# Patient Record
Sex: Female | Born: 1935 | Race: White | Hispanic: No | Marital: Married | Smoking: Former smoker
Health system: Southern US, Community
[De-identification: ages and names within clinical notes are randomized; demographics above are authoritative.]

## PROBLEM LIST (undated history)

## (undated) DIAGNOSIS — H269 Unspecified cataract: Secondary | ICD-10-CM

## (undated) DIAGNOSIS — E079 Disorder of thyroid, unspecified: Secondary | ICD-10-CM

## (undated) DIAGNOSIS — J449 Chronic obstructive pulmonary disease, unspecified: Secondary | ICD-10-CM

## (undated) DIAGNOSIS — K589 Irritable bowel syndrome without diarrhea: Secondary | ICD-10-CM

## (undated) DIAGNOSIS — I509 Heart failure, unspecified: Secondary | ICD-10-CM

## (undated) DIAGNOSIS — I219 Acute myocardial infarction, unspecified: Secondary | ICD-10-CM

## (undated) DIAGNOSIS — I10 Essential (primary) hypertension: Secondary | ICD-10-CM

## (undated) DIAGNOSIS — C50919 Malignant neoplasm of unspecified site of unspecified female breast: Secondary | ICD-10-CM

## (undated) HISTORY — PX: EYE SURGERY: SHX253

## (undated) HISTORY — PX: CHOLECYSTECTOMY: SHX55

## (undated) HISTORY — PX: STENT PLACEMENT VASCULAR (ARMC HX): HXRAD1737

## (undated) HISTORY — PX: CARDIAC SURGERY: SHX584

## (undated) HISTORY — PX: ROTATOR CUFF REPAIR: SHX139

## (undated) HISTORY — PX: ABDOMINAL HYSTERECTOMY: SHX81

## (undated) HISTORY — PX: BREAST SURGERY: SHX581

---

## 2011-12-25 ENCOUNTER — Ambulatory Visit: Payer: Self-pay | Admitting: Internal Medicine

## 2012-02-14 ENCOUNTER — Ambulatory Visit: Payer: Self-pay | Admitting: Internal Medicine

## 2012-03-05 ENCOUNTER — Ambulatory Visit: Payer: Self-pay | Admitting: Orthopedic Surgery

## 2012-03-05 LAB — BASIC METABOLIC PANEL
Anion Gap: 8 (ref 7–16)
Calcium, Total: 9.4 mg/dL (ref 8.5–10.1)
Co2: 24 mmol/L (ref 21–32)
Creatinine: 1.74 mg/dL — ABNORMAL HIGH (ref 0.60–1.30)
EGFR (African American): 33 — ABNORMAL LOW
EGFR (Non-African Amer.): 28 — ABNORMAL LOW
Glucose: 98 mg/dL (ref 65–99)
Potassium: 4.3 mmol/L (ref 3.5–5.1)
Sodium: 140 mmol/L (ref 136–145)

## 2012-03-05 LAB — CBC
HCT: 39.7 % (ref 35.0–47.0)
HGB: 13 g/dL (ref 12.0–16.0)
RDW: 13.8 % (ref 11.5–14.5)
WBC: 10.1 10*3/uL (ref 3.6–11.0)

## 2012-03-05 LAB — PROTIME-INR
INR: 1
Prothrombin Time: 13.7 secs (ref 11.5–14.7)

## 2012-03-05 LAB — APTT: Activated PTT: 26.1 secs (ref 23.6–35.9)

## 2012-03-18 ENCOUNTER — Ambulatory Visit: Payer: Self-pay | Admitting: Orthopedic Surgery

## 2013-04-07 DIAGNOSIS — E7800 Pure hypercholesterolemia, unspecified: Secondary | ICD-10-CM | POA: Diagnosis not present

## 2013-04-07 DIAGNOSIS — I10 Essential (primary) hypertension: Secondary | ICD-10-CM | POA: Diagnosis not present

## 2013-04-07 DIAGNOSIS — I2510 Atherosclerotic heart disease of native coronary artery without angina pectoris: Secondary | ICD-10-CM | POA: Diagnosis not present

## 2013-04-07 DIAGNOSIS — E559 Vitamin D deficiency, unspecified: Secondary | ICD-10-CM | POA: Diagnosis not present

## 2013-04-07 DIAGNOSIS — K219 Gastro-esophageal reflux disease without esophagitis: Secondary | ICD-10-CM | POA: Diagnosis not present

## 2013-04-07 DIAGNOSIS — J9620 Acute and chronic respiratory failure, unspecified whether with hypoxia or hypercapnia: Secondary | ICD-10-CM | POA: Diagnosis not present

## 2013-04-07 DIAGNOSIS — I119 Hypertensive heart disease without heart failure: Secondary | ICD-10-CM | POA: Diagnosis not present

## 2013-05-29 IMAGING — CR DG CHEST 2V
1 series · 2 of 2 positions shown · non-contrast
Comparison: none

REASON FOR EXAM: sob, htn
COMMENTS:

PROCEDURE:     DXR - DXR CHEST PA (OR AP) AND LATERAL  - March 05, 2012 [DATE]
RESULT:     Comparison: None.

[Series 1: pa · 0.17mm/px · 2 of 2 slices shown]
[im 1/2]
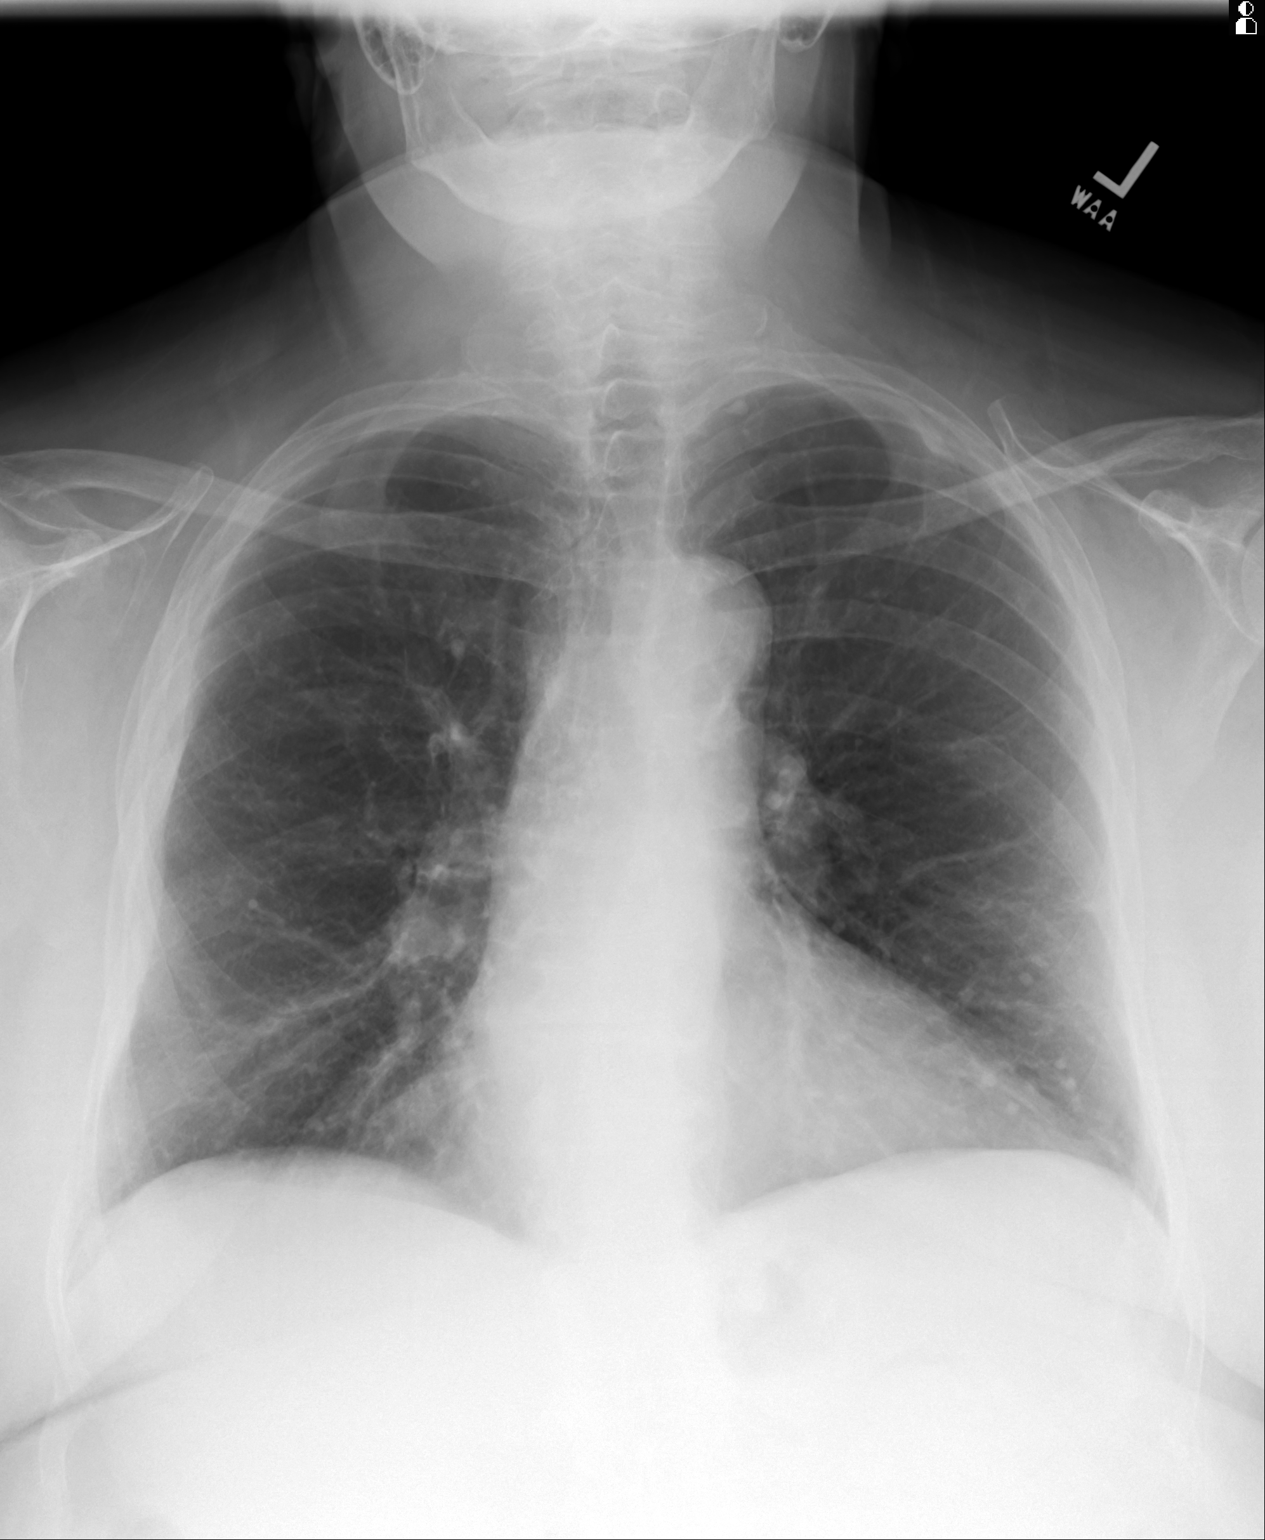
[im 2/2]
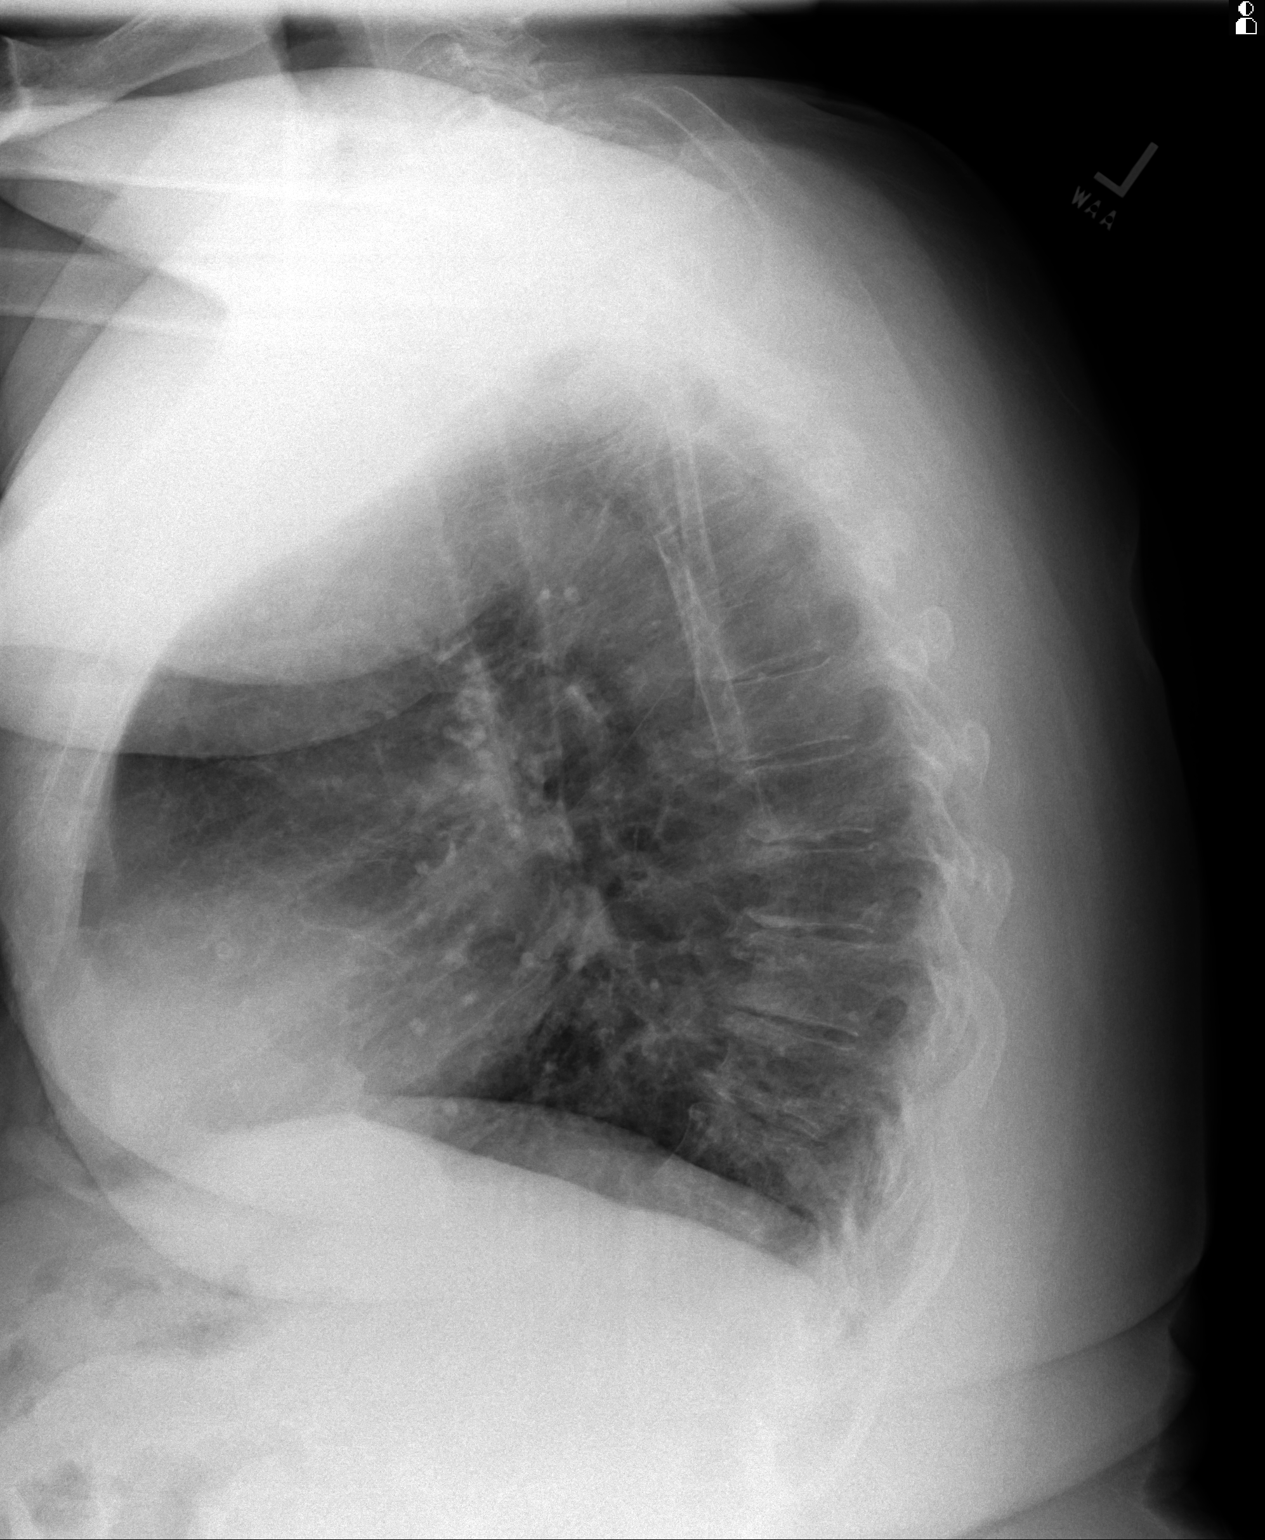

[2 of 2 positions shown; findings below may reference images not displayed]

FINDINGS: The heart is enlarged. Tiny round nodules in the bilateral lungs are likely
calcified given their density to small size. Otherwise, no focal pulmonary
opacities.
IMPRESSION: No acute cardiopulmonary disease.

[REDACTED]

## 2013-09-08 DIAGNOSIS — N183 Chronic kidney disease, stage 3 unspecified: Secondary | ICD-10-CM | POA: Diagnosis not present

## 2013-09-08 DIAGNOSIS — E46 Unspecified protein-calorie malnutrition: Secondary | ICD-10-CM | POA: Diagnosis not present

## 2013-09-08 DIAGNOSIS — I10 Essential (primary) hypertension: Secondary | ICD-10-CM | POA: Diagnosis not present

## 2013-09-08 DIAGNOSIS — I2510 Atherosclerotic heart disease of native coronary artery without angina pectoris: Secondary | ICD-10-CM | POA: Diagnosis not present

## 2013-09-30 DIAGNOSIS — N2581 Secondary hyperparathyroidism of renal origin: Secondary | ICD-10-CM | POA: Diagnosis not present

## 2013-09-30 DIAGNOSIS — N184 Chronic kidney disease, stage 4 (severe): Secondary | ICD-10-CM | POA: Diagnosis not present

## 2013-09-30 DIAGNOSIS — D631 Anemia in chronic kidney disease: Secondary | ICD-10-CM | POA: Diagnosis not present

## 2013-09-30 DIAGNOSIS — I10 Essential (primary) hypertension: Secondary | ICD-10-CM | POA: Diagnosis not present

## 2013-09-30 DIAGNOSIS — N039 Chronic nephritic syndrome with unspecified morphologic changes: Secondary | ICD-10-CM | POA: Diagnosis not present

## 2013-10-08 ENCOUNTER — Ambulatory Visit: Payer: Self-pay | Admitting: Nephrology

## 2013-10-08 DIAGNOSIS — N184 Chronic kidney disease, stage 4 (severe): Secondary | ICD-10-CM | POA: Diagnosis not present

## 2013-10-08 DIAGNOSIS — N269 Renal sclerosis, unspecified: Secondary | ICD-10-CM | POA: Diagnosis not present

## 2013-10-15 DIAGNOSIS — N184 Chronic kidney disease, stage 4 (severe): Secondary | ICD-10-CM | POA: Diagnosis not present

## 2013-10-15 DIAGNOSIS — D631 Anemia in chronic kidney disease: Secondary | ICD-10-CM | POA: Diagnosis not present

## 2013-10-15 DIAGNOSIS — I10 Essential (primary) hypertension: Secondary | ICD-10-CM | POA: Diagnosis not present

## 2013-10-15 DIAGNOSIS — N2581 Secondary hyperparathyroidism of renal origin: Secondary | ICD-10-CM | POA: Diagnosis not present

## 2014-01-17 ENCOUNTER — Emergency Department: Payer: Self-pay | Admitting: Emergency Medicine

## 2014-01-17 DIAGNOSIS — S92401A Displaced unspecified fracture of right great toe, initial encounter for closed fracture: Secondary | ICD-10-CM | POA: Diagnosis not present

## 2014-01-17 DIAGNOSIS — I10 Essential (primary) hypertension: Secondary | ICD-10-CM | POA: Diagnosis not present

## 2014-01-17 DIAGNOSIS — S9031XA Contusion of right foot, initial encounter: Secondary | ICD-10-CM | POA: Diagnosis not present

## 2014-01-17 DIAGNOSIS — S92411A Displaced fracture of proximal phalanx of right great toe, initial encounter for closed fracture: Secondary | ICD-10-CM | POA: Diagnosis not present

## 2014-01-20 DIAGNOSIS — S92411A Displaced fracture of proximal phalanx of right great toe, initial encounter for closed fracture: Secondary | ICD-10-CM | POA: Diagnosis not present

## 2014-02-10 DIAGNOSIS — S92411D Displaced fracture of proximal phalanx of right great toe, subsequent encounter for fracture with routine healing: Secondary | ICD-10-CM | POA: Diagnosis not present

## 2014-02-18 DIAGNOSIS — D631 Anemia in chronic kidney disease: Secondary | ICD-10-CM | POA: Diagnosis not present

## 2014-02-18 DIAGNOSIS — N184 Chronic kidney disease, stage 4 (severe): Secondary | ICD-10-CM | POA: Diagnosis not present

## 2014-02-18 DIAGNOSIS — I10 Essential (primary) hypertension: Secondary | ICD-10-CM | POA: Diagnosis not present

## 2014-02-18 DIAGNOSIS — N2581 Secondary hyperparathyroidism of renal origin: Secondary | ICD-10-CM | POA: Diagnosis not present

## 2014-03-03 DIAGNOSIS — H3530 Unspecified macular degeneration: Secondary | ICD-10-CM | POA: Diagnosis not present

## 2014-05-10 DIAGNOSIS — N184 Chronic kidney disease, stage 4 (severe): Secondary | ICD-10-CM | POA: Diagnosis not present

## 2014-05-10 DIAGNOSIS — E875 Hyperkalemia: Secondary | ICD-10-CM | POA: Diagnosis not present

## 2014-05-10 DIAGNOSIS — N2581 Secondary hyperparathyroidism of renal origin: Secondary | ICD-10-CM | POA: Diagnosis not present

## 2014-05-10 DIAGNOSIS — R809 Proteinuria, unspecified: Secondary | ICD-10-CM | POA: Diagnosis not present

## 2014-05-10 DIAGNOSIS — I129 Hypertensive chronic kidney disease with stage 1 through stage 4 chronic kidney disease, or unspecified chronic kidney disease: Secondary | ICD-10-CM | POA: Diagnosis not present

## 2014-05-14 DIAGNOSIS — Z23 Encounter for immunization: Secondary | ICD-10-CM | POA: Diagnosis not present

## 2014-06-02 DIAGNOSIS — I129 Hypertensive chronic kidney disease with stage 1 through stage 4 chronic kidney disease, or unspecified chronic kidney disease: Secondary | ICD-10-CM | POA: Diagnosis not present

## 2014-06-02 DIAGNOSIS — N184 Chronic kidney disease, stage 4 (severe): Secondary | ICD-10-CM | POA: Diagnosis not present

## 2014-06-13 DIAGNOSIS — I119 Hypertensive heart disease without heart failure: Secondary | ICD-10-CM | POA: Diagnosis not present

## 2014-06-13 DIAGNOSIS — J449 Chronic obstructive pulmonary disease, unspecified: Secondary | ICD-10-CM | POA: Diagnosis not present

## 2014-11-07 DIAGNOSIS — I10 Essential (primary) hypertension: Secondary | ICD-10-CM | POA: Diagnosis not present

## 2014-11-07 DIAGNOSIS — E875 Hyperkalemia: Secondary | ICD-10-CM | POA: Diagnosis not present

## 2014-11-07 DIAGNOSIS — N2581 Secondary hyperparathyroidism of renal origin: Secondary | ICD-10-CM | POA: Diagnosis not present

## 2014-11-07 DIAGNOSIS — N184 Chronic kidney disease, stage 4 (severe): Secondary | ICD-10-CM | POA: Diagnosis not present

## 2014-11-11 DIAGNOSIS — I119 Hypertensive heart disease without heart failure: Secondary | ICD-10-CM | POA: Diagnosis not present

## 2014-11-11 DIAGNOSIS — J449 Chronic obstructive pulmonary disease, unspecified: Secondary | ICD-10-CM | POA: Diagnosis not present

## 2015-02-05 DIAGNOSIS — Z23 Encounter for immunization: Secondary | ICD-10-CM | POA: Diagnosis not present

## 2015-04-28 DIAGNOSIS — R6884 Jaw pain: Secondary | ICD-10-CM | POA: Diagnosis not present

## 2015-04-28 DIAGNOSIS — M542 Cervicalgia: Secondary | ICD-10-CM | POA: Diagnosis not present

## 2015-04-29 ENCOUNTER — Emergency Department
Admission: EM | Admit: 2015-04-29 | Discharge: 2015-04-29 | Disposition: A | Discharge status: Home / Self Care | Payer: Medicare Other | Attending: Emergency Medicine | Admitting: Emergency Medicine

## 2015-04-29 ENCOUNTER — Emergency Department: Payer: Medicare Other

## 2015-04-29 DIAGNOSIS — R6884 Jaw pain: Secondary | ICD-10-CM | POA: Insufficient documentation

## 2015-04-29 DIAGNOSIS — T402X5A Adverse effect of other opioids, initial encounter: Secondary | ICD-10-CM | POA: Insufficient documentation

## 2015-04-29 DIAGNOSIS — R112 Nausea with vomiting, unspecified: Secondary | ICD-10-CM | POA: Diagnosis not present

## 2015-04-29 DIAGNOSIS — G8929 Other chronic pain: Secondary | ICD-10-CM | POA: Diagnosis not present

## 2015-04-29 DIAGNOSIS — Z87891 Personal history of nicotine dependence: Secondary | ICD-10-CM | POA: Diagnosis not present

## 2015-04-29 DIAGNOSIS — I10 Essential (primary) hypertension: Secondary | ICD-10-CM | POA: Insufficient documentation

## 2015-04-29 DIAGNOSIS — Z79899 Other long term (current) drug therapy: Secondary | ICD-10-CM | POA: Diagnosis not present

## 2015-04-29 HISTORY — DX: Acute myocardial infarction, unspecified: I21.9

## 2015-04-29 HISTORY — DX: Disorder of thyroid, unspecified: E07.9

## 2015-04-29 HISTORY — DX: Irritable bowel syndrome without diarrhea: K58.9

## 2015-04-29 HISTORY — DX: Chronic obstructive pulmonary disease, unspecified: J44.9

## 2015-04-29 HISTORY — DX: Essential (primary) hypertension: I10

## 2015-04-29 HISTORY — DX: Unspecified cataract: H26.9

## 2015-04-29 HISTORY — DX: Heart failure, unspecified: I50.9

## 2015-04-29 HISTORY — DX: Malignant neoplasm of unspecified site of unspecified female breast: C50.919

## 2015-04-29 LAB — CBC WITH DIFFERENTIAL/PLATELET
BASOS ABS: 0.1 10*3/uL (ref 0–0.1)
BASOS PCT: 1 %
EOS ABS: 0.2 10*3/uL (ref 0–0.7)
Eosinophils Relative: 2 %
HEMATOCRIT: 39.9 % (ref 35.0–47.0)
HEMOGLOBIN: 12.8 g/dL (ref 12.0–16.0)
Lymphocytes Relative: 11 %
Lymphs Abs: 1.2 10*3/uL (ref 1.0–3.6)
MCH: 28.3 pg (ref 26.0–34.0)
MCHC: 32 g/dL (ref 32.0–36.0)
MCV: 88.6 fL (ref 80.0–100.0)
Monocytes Absolute: 0.7 10*3/uL (ref 0.2–0.9)
Monocytes Relative: 7 %
NEUTROS ABS: 8.5 10*3/uL — AB (ref 1.4–6.5)
NEUTROS PCT: 79 %
Platelets: 297 10*3/uL (ref 150–440)
RBC: 4.51 MIL/uL (ref 3.80–5.20)
RDW: 14 % (ref 11.5–14.5)
WBC: 10.8 10*3/uL (ref 3.6–11.0)

## 2015-04-29 LAB — COMPREHENSIVE METABOLIC PANEL
ALBUMIN: 3.4 g/dL — AB (ref 3.5–5.0)
ALK PHOS: 63 U/L (ref 38–126)
ALT: 13 U/L — ABNORMAL LOW (ref 14–54)
AST: 15 U/L (ref 15–41)
Anion gap: 7 (ref 5–15)
BILIRUBIN TOTAL: 0.3 mg/dL (ref 0.3–1.2)
BUN: 35 mg/dL — AB (ref 6–20)
CO2: 23 mmol/L (ref 22–32)
Calcium: 8.9 mg/dL (ref 8.9–10.3)
Chloride: 104 mmol/L (ref 101–111)
Creatinine, Ser: 1.62 mg/dL — ABNORMAL HIGH (ref 0.44–1.00)
GFR calc Af Amer: 34 mL/min — ABNORMAL LOW (ref >60)
GFR calc non Af Amer: 30 mL/min — ABNORMAL LOW (ref >60)
GLUCOSE: 150 mg/dL — AB (ref 65–99)
POTASSIUM: 4.9 mmol/L (ref 3.5–5.1)
SODIUM: 134 mmol/L — AB (ref 135–145)
TOTAL PROTEIN: 7.6 g/dL (ref 6.5–8.1)

## 2015-04-29 LAB — TROPONIN I

## 2015-04-29 MED ORDER — ONDANSETRON HCL 4 MG/2ML IJ SOLN
4.0000 mg | Freq: Once | INTRAMUSCULAR | Status: AC
  Administered 2015-04-29: 4 mg via INTRAVENOUS
  Filled 2015-04-29: qty 2

## 2015-04-29 MED ORDER — DOCUSATE SODIUM 100 MG PO CAPS: ORAL_CAPSULE | ORAL | Status: DC

## 2015-04-29 MED ORDER — HYDROCODONE-ACETAMINOPHEN 5-325 MG PO TABS: 1.0000 | ORAL_TABLET | ORAL | Status: DC | PRN

## 2015-04-29 MED ORDER — MORPHINE SULFATE (PF) 4 MG/ML IV SOLN
4.0000 mg | Freq: Once | INTRAVENOUS | Status: AC
  Administered 2015-04-29: 4 mg via INTRAVENOUS
  Filled 2015-04-29: qty 1

## 2015-04-29 NOTE — ED Notes (Signed)
Pt 02 dropped to 80%, put pt back on 1L [place], pt 02 up to 94%.

## 2015-04-29 NOTE — ED Notes (Addendum)
Pt presents via [facility] with jaw and neck pain and headache. Pt states jaw pain has been there for "a while" and headache has been present for a few days. Pt gave herself 2 oxycodone (1 at 21:30 and 1 at 2300) left over from a toe surgery last summer, then began dry heaving, per EMS no emesis. Pt states she did not eat anything when she took the Oxy. Pt is now nauseous. Pt has hx of MI, COPD.

## 2015-04-29 NOTE — ED Notes (Addendum)
Pt 02 kept dropping to low and mid 80's, pt was put on [place] 1-2 L to bring 02 up.  Per Dr. Karma Greaser okay to eat and drink. Pt provided crackers and water.

## 2015-04-29 NOTE — ED Provider Notes (Signed)
[facility] Emergency Department Provider Note  ____________________________________________  Time seen: Approximately 2:39 AM  I have reviewed the triage vital signs and the nursing notes.   HISTORY  Chief Complaint Jaw Pain and Headache    HPI Deborah Poole is a 80 y.o. female with an extensive chronic medical history including COPD who does not take use oxygen at baseline who presents with acute worsening of chronic jaw pain.  She states that it starts in the left lower jaw and radiates into her neck as well as around to the right side of her jaw.  It is worse at night and with movement and using the jaw.  It is not originating in the neck or head although she feels that it radiates throughout her head.  It has been present although very mild for months now, but over the last 2 days it has become severe and constant.  She does not remember any traumatic injury.  She tried taking some oxycodone that she had at home but it made her nauseated and she vomited once.  She states that the pain is terrible and she needs help with that although she realizes we may not able to know for sure what is causing it.  She states that when she had a heart attack years ago she had pain radiating to the jaw, but this feels different.She describes it as a severe deep aching pain, not a sharp pain, and based on her description it does not involve the region around her temporomandibular joint.   Past Medical History  Diagnosis Date  . Thyroid disease   . CHF (congestive heart failure) ([place])   . Cataract   . Breast cancer ([place])   . Heart attack ([place])   . COPD (chronic obstructive pulmonary disease) ([place])   . Irritable bowel syndrome (IBS)   . Hypertension     There are no active problems to display for this patient.   Past Surgical History  Procedure Laterality Date  . Cholecystectomy    . Breast surgery    . Cardiac surgery    . Eye surgery    . Stent placement vascular ([facility]  hx)    . Rotator cuff repair    . Abdominal hysterectomy      Current Outpatient Rx  Name  Route  Sig  Dispense  Refill  . ALPRAZolam (XANAX) 0.25 MG tablet   Oral   Take 0.25 mg by mouth at bedtime as needed for sleep.         . carvedilol (COREG) 3.125 MG tablet   Oral   Take 1 tablet by mouth 2 (two) times daily.         . cholecalciferol (VITAMIN D) 1000 units tablet   Oral   Take 1,000 Units by mouth daily.         Marland Kitchen omeprazole (PRILOSEC) 10 MG capsule   Oral   Take 10 mg by mouth daily.         . simvastatin (ZOCOR) 40 MG tablet   Oral   Take 40 mg by mouth every evening.         Marland Kitchen spironolactone (ALDACTONE) 25 MG tablet   Oral   Take 25 mg by mouth daily.           Allergies Review of patient's allergies indicates no known allergies.  History reviewed. No pertinent family history.  Social History Social History  Substance Use Topics  . Smoking status: Former Research scientist (life sciences)  .  Smokeless tobacco: None  . Alcohol Use: Yes     Comment: once a month or so     Review of Systems Constitutional: No fever/chills Eyes: No visual changes. ENT: No sore throat.  Pain in her jaw, chin, and radiating to her neck. Cardiovascular: Denies chest pain. Respiratory: Denies shortness of breath. Gastrointestinal: No abdominal pain.  Episode of nausea and vomiting after taking oxycodone.  No diarrhea.  No constipation. Genitourinary: Negative for dysuria. Musculoskeletal: Negative for back pain. Skin: Negative for rash. Neurological: Negative for headaches, focal weakness or numbness.  10-point ROS otherwise negative.  ____________________________________________   PHYSICAL EXAM:  VITAL SIGNS: ED Triage Vitals  Enc Vitals Group     BP 04/29/15 0119 145/107 mmHg     Pulse Rate 04/29/15 0119 81     Resp 04/29/15 0119 20     Temp 04/29/15 0119 98.7 F (37.1 C)     Temp Source 04/29/15 0119 Oral     SpO2 04/29/15 0119 92 %     Weight 04/29/15 0119 230 lb  (104.327 kg)     Height 04/29/15 0119 5\' 1"  (1.549 m)     Head Cir --      Peak Flow --      Pain Score 04/29/15 0122 6     Pain Loc --      Pain Edu? --      Excl. in [place]? --     Constitutional: Alert and oriented. Well appearing and in no acute distress with appearance of multiple vehicle issues Eyes: Conjunctivae are normal. PERRL. EOMI. Head: Atraumatic. Nose: No congestion/rhinnorhea. Mouth/Throat: Mucous membranes are moist.  Oropharynx non-erythematous.  Edentulous in the bottom jaw.  No evidence of infection or abscess.  No tenderness to palpation from the inside.  I do not appreciate any cervical lymphadenopathy or bony abnormalities of the jaw upon palpation of the outside of her mandible although she reports it is tender "deep inside".  There is no misalignment.  Even though she is edentulous she is able to bite down on a tongue depressor and does not experience any pain or instability when I forcibly move the tongue depressor against her bite. Neck: No stridor.   Cardiovascular: Normal rate, regular rhythm. Grossly normal heart sounds.  Good peripheral circulation. Respiratory: Normal respiratory effort.  No retractions. Lungs CTAB. Gastrointestinal: Soft and nontender. No distention. No abdominal bruits. No CVA tenderness. Musculoskeletal: No lower extremity tenderness nor edema.  No joint effusions. Neurologic:  Normal speech and language. No gross focal neurologic deficits are appreciated.  Skin:  Skin is warm, dry and intact. No rash noted. Psychiatric: Mood and affect are normal. Speech and behavior are normal.  ____________________________________________   LABS (all labs ordered are listed, but only abnormal results are displayed)  Labs Reviewed - No data to display ____________________________________________  EKG  ED ECG REPORT I, Anab Vivar, the attending physician, personally viewed and interpreted this ECG.   Date: 04/29/2015  EKG Time: 1:17  Rate:  84  Rhythm: normal sinus rhythm  Axis: Left axis deviation  Intervals:Occasional PVC, left anterior fascicular block  ST&T Change: Non-specific ST segment / T-wave changes, but no evidence of acute ischemia.   ____________________________________________  RADIOLOGY   No results found.  ____________________________________________   PROCEDURES  Procedure(s) performed: None  Critical Care performed: No ____________________________________________   INITIAL IMPRESSION / ASSESSMENT AND PLAN / ED COURSE  Pertinent labs & imaging results that were available during my care of the patient were reviewed  by me and considered in my medical decision making (see chart for details).  This would be a highly unusual atypical presentation of ACS.  It is most likely a musculoskeletal disturbance and her face and jaw, although it is not consistent with TMJ.  I will evaluate with a maxillofacial CT to make sure she does not have any destructive bony lesions which is causing the worsening of her chronic pain, but I anticipate it will likely be unremarkable.  We will also check basic lab work to make sure that this is not an unusual anginal equivalent.  I will attempt to control her pain but stressed with her the need to follow up as an outpatient  ----------------------------------------- 4:41 AM on 04/29/2015 -----------------------------------------  The workup has been unremarkable.  The patient states that she has chronic COPD and that her oxygen level frequently drops down and is typically at the low 90s at baseline which it is been here in the emergency department.  It drops down low or when she is sleeping but I have observed that when does drop below she is not always getting a good waveform.  She has no acute respiratory distress, reports no difficulty breathing, and has clear lung sounds.  I do not believe she needs further intervention for the intermittent hypoxemia in the setting of  chronic COPD and no symptoms.  Her CT scan of her face is essentially normal and although she does have opacification of the right maxillary sinus she has no signs or symptoms of sinusitis.  I will treat her with pain medicine for her acute on chronic jaw pain and advised close outpatient follow-up.  She understands and agrees with the plan.  ____________________________________________  FINAL CLINICAL IMPRESSION(S) / ED DIAGNOSES  Final diagnoses:  Jaw pain, non-TMJ      NEW MEDICATIONS STARTED DURING THIS VISIT:  New Prescriptions   DOCUSATE SODIUM (COLACE) 100 MG CAPSULE    Take 1 tablet once or twice daily as needed for constipation while taking narcotic pain medicine   HYDROCODONE-ACETAMINOPHEN (NORCO/VICODIN) 5-325 MG TABLET    Take 1-2 tablets by mouth every 4 (four) hours as needed for moderate pain.     Hinda Kehr, MD 04/29/15 [phone]

## 2015-04-29 NOTE — ED Notes (Signed)
Took pt off 02. 02 holding at 91-92%.

## 2015-04-29 NOTE — ED Notes (Signed)
Pt transported to CT via stretcher.  

## 2015-04-29 NOTE — ED Notes (Signed)
Dr. Forbach at bedside.  

## 2015-04-29 NOTE — Discharge Instructions (Signed)
As we discussed, your workup today was reassuring.  Though we do not know exactly what is causing your symptoms, it appears that you have no emergent medical condition at this time are safe to go home and follow up as recommended in this paperwork.  Please return immediately to the Emergency Department if you develop any new or worsening symptoms that concern you.   Pain Without a Known Cause WHAT IS PAIN WITHOUT A KNOWN CAUSE? Pain can occur in any part of the body and can range from mild to severe. Sometimes no cause can be found for why you are having pain. Some types of pain that can occur without a known cause include:   Headache.  Back pain.  Abdominal pain.  Neck pain. HOW IS PAIN WITHOUT A KNOWN CAUSE DIAGNOSED?  Your health care provider will try to find the cause of your pain. This may include:  Physical exam.  Medical history.  Blood tests.  Urine tests.  X-rays. If no cause is found, your health care provider may diagnose you with pain without a known cause.  IS THERE TREATMENT FOR PAIN WITHOUT A CAUSE?  Treatment depends on the kind of pain you have. Your health care provider may prescribe medicines to help relieve your pain.  WHAT CAN I DO AT HOME FOR MY PAIN?   Take medicines only as directed by your health care provider.  Stop any activities that cause pain. During periods of severe pain, bed rest may help.  Try to reduce your stress with activities such as yoga or meditation. Talk to your health care provider for other stress-reducing activity recommendations.  Exercise regularly, if approved by your health care provider.  Eat a healthy diet that includes fruits and vegetables. This may improve pain. Talk to your health care provider if you have any questions about your diet. WHAT IF MY PAIN DOES NOT GET BETTER?  If you have a painful condition and no reason can be found for the pain or the pain gets worse, it is important to follow up with your health care  provider. It may be necessary to repeat tests and look further for a possible cause.    This information is not intended to replace advice given to you by your health care provider. Make sure you discuss any questions you have with your health care provider.   Document Released: 12/11/2000 Document Revised: 04/08/2014 Document Reviewed: 08/03/2013 Elsevier Interactive Patient Education [employer].

## 2015-04-29 NOTE — ED Notes (Signed)
Took pt off oxygen, will monitor saturation.

## 2015-06-01 ENCOUNTER — Emergency Department: Payer: Medicare Other

## 2015-06-01 ENCOUNTER — Inpatient Hospital Stay
Admission: EM | Admit: 2015-06-01 | Discharge: 2015-06-02 | DRG: 683 | Disposition: A | Discharge status: Home Health | Payer: Medicare Other | Attending: Internal Medicine | Admitting: Internal Medicine

## 2015-06-01 ENCOUNTER — Encounter: Payer: Self-pay | Admitting: Emergency Medicine

## 2015-06-01 DIAGNOSIS — Z87891 Personal history of nicotine dependence: Secondary | ICD-10-CM

## 2015-06-01 DIAGNOSIS — I129 Hypertensive chronic kidney disease with stage 1 through stage 4 chronic kidney disease, or unspecified chronic kidney disease: Secondary | ICD-10-CM | POA: Diagnosis not present

## 2015-06-01 DIAGNOSIS — M5137 Other intervertebral disc degeneration, lumbosacral region: Secondary | ICD-10-CM | POA: Diagnosis present

## 2015-06-01 DIAGNOSIS — J441 Chronic obstructive pulmonary disease with (acute) exacerbation: Secondary | ICD-10-CM

## 2015-06-01 DIAGNOSIS — D3501 Benign neoplasm of right adrenal gland: Secondary | ICD-10-CM | POA: Diagnosis present

## 2015-06-01 DIAGNOSIS — K589 Irritable bowel syndrome without diarrhea: Secondary | ICD-10-CM | POA: Diagnosis present

## 2015-06-01 DIAGNOSIS — M1611 Unilateral primary osteoarthritis, right hip: Secondary | ICD-10-CM | POA: Diagnosis present

## 2015-06-01 DIAGNOSIS — I509 Heart failure, unspecified: Secondary | ICD-10-CM | POA: Diagnosis present

## 2015-06-01 DIAGNOSIS — J449 Chronic obstructive pulmonary disease, unspecified: Secondary | ICD-10-CM | POA: Diagnosis present

## 2015-06-01 DIAGNOSIS — E079 Disorder of thyroid, unspecified: Secondary | ICD-10-CM | POA: Diagnosis present

## 2015-06-01 DIAGNOSIS — Z6841 Body Mass Index (BMI) 40.0 and over, adult: Secondary | ICD-10-CM

## 2015-06-01 DIAGNOSIS — Z853 Personal history of malignant neoplasm of breast: Secondary | ICD-10-CM | POA: Diagnosis not present

## 2015-06-01 DIAGNOSIS — I130 Hypertensive heart and chronic kidney disease with heart failure and stage 1 through stage 4 chronic kidney disease, or unspecified chronic kidney disease: Secondary | ICD-10-CM | POA: Diagnosis present

## 2015-06-01 DIAGNOSIS — K5732 Diverticulitis of large intestine without perforation or abscess without bleeding: Secondary | ICD-10-CM | POA: Diagnosis present

## 2015-06-01 DIAGNOSIS — N183 Chronic kidney disease, stage 3 (moderate): Secondary | ICD-10-CM | POA: Diagnosis present

## 2015-06-01 DIAGNOSIS — M25551 Pain in right hip: Secondary | ICD-10-CM | POA: Diagnosis not present

## 2015-06-01 DIAGNOSIS — M47816 Spondylosis without myelopathy or radiculopathy, lumbar region: Secondary | ICD-10-CM | POA: Diagnosis present

## 2015-06-01 DIAGNOSIS — N179 Acute kidney failure, unspecified: Secondary | ICD-10-CM | POA: Diagnosis present

## 2015-06-01 DIAGNOSIS — K449 Diaphragmatic hernia without obstruction or gangrene: Secondary | ICD-10-CM | POA: Diagnosis present

## 2015-06-01 DIAGNOSIS — J9611 Chronic respiratory failure with hypoxia: Secondary | ICD-10-CM | POA: Diagnosis present

## 2015-06-01 DIAGNOSIS — Z471 Aftercare following joint replacement surgery: Secondary | ICD-10-CM | POA: Diagnosis not present

## 2015-06-01 DIAGNOSIS — I252 Old myocardial infarction: Secondary | ICD-10-CM

## 2015-06-01 DIAGNOSIS — H269 Unspecified cataract: Secondary | ICD-10-CM | POA: Diagnosis present

## 2015-06-01 DIAGNOSIS — N170 Acute kidney failure with tubular necrosis: Secondary | ICD-10-CM | POA: Diagnosis not present

## 2015-06-01 DIAGNOSIS — Z79899 Other long term (current) drug therapy: Secondary | ICD-10-CM

## 2015-06-01 DIAGNOSIS — Z96641 Presence of right artificial hip joint: Secondary | ICD-10-CM | POA: Diagnosis present

## 2015-06-01 DIAGNOSIS — M4316 Spondylolisthesis, lumbar region: Secondary | ICD-10-CM | POA: Diagnosis present

## 2015-06-01 DIAGNOSIS — Z8249 Family history of ischemic heart disease and other diseases of the circulatory system: Secondary | ICD-10-CM | POA: Diagnosis not present

## 2015-06-01 DIAGNOSIS — R52 Pain, unspecified: Secondary | ICD-10-CM

## 2015-06-01 DIAGNOSIS — E669 Obesity, unspecified: Secondary | ICD-10-CM | POA: Diagnosis present

## 2015-06-01 DIAGNOSIS — I10 Essential (primary) hypertension: Secondary | ICD-10-CM | POA: Diagnosis not present

## 2015-06-01 DIAGNOSIS — N172 Acute kidney failure with medullary necrosis: Secondary | ICD-10-CM | POA: Diagnosis not present

## 2015-06-01 LAB — CBC WITH DIFFERENTIAL/PLATELET
Basophils Absolute: 0.1 10*3/uL (ref 0–0.1)
Basophils Relative: 1 %
EOS ABS: 0.2 10*3/uL (ref 0–0.7)
EOS PCT: 2 %
HCT: 42.3 % (ref 35.0–47.0)
Hemoglobin: 13.8 g/dL (ref 12.0–16.0)
LYMPHS ABS: 1.4 10*3/uL (ref 1.0–3.6)
Lymphocytes Relative: 15 %
MCH: 28.7 pg (ref 26.0–34.0)
MCHC: 32.6 g/dL (ref 32.0–36.0)
MCV: 87.9 fL (ref 80.0–100.0)
MONOS PCT: 7 %
Monocytes Absolute: 0.7 10*3/uL (ref 0.2–0.9)
Neutro Abs: 7.1 10*3/uL — ABNORMAL HIGH (ref 1.4–6.5)
Neutrophils Relative %: 75 %
PLATELETS: 270 10*3/uL (ref 150–440)
RBC: 4.81 MIL/uL (ref 3.80–5.20)
RDW: 13.9 % (ref 11.5–14.5)
WBC: 9.5 10*3/uL (ref 3.6–11.0)

## 2015-06-01 LAB — BASIC METABOLIC PANEL
ANION GAP: 12 (ref 5–15)
BUN: 66 mg/dL — AB (ref 6–20)
CHLORIDE: 103 mmol/L (ref 101–111)
CO2: 21 mmol/L — AB (ref 22–32)
CREATININE: 2.84 mg/dL — AB (ref 0.44–1.00)
Calcium: 9.7 mg/dL (ref 8.9–10.3)
GFR calc non Af Amer: 15 mL/min — ABNORMAL LOW (ref >60)
GFR, EST AFRICAN AMERICAN: 17 mL/min — AB (ref >60)
Glucose, Bld: 91 mg/dL (ref 65–99)
POTASSIUM: 4.8 mmol/L (ref 3.5–5.1)
Sodium: 136 mmol/L (ref 135–145)

## 2015-06-01 MED ORDER — CARVEDILOL 3.125 MG PO TABS
3.1250 mg | ORAL_TABLET | Freq: Two times a day (BID) | ORAL | Status: DC
  Administered 2015-06-02: 3.125 mg via ORAL
  Filled 2015-06-01: qty 1

## 2015-06-01 MED ORDER — HEPARIN SODIUM (PORCINE) 5000 UNIT/ML IJ SOLN
5000.0000 [IU] | Freq: Three times a day (TID) | INTRAMUSCULAR | Status: DC
  Administered 2015-06-01 – 2015-06-02 (×2): 5000 [IU] via SUBCUTANEOUS
  Filled 2015-06-01 (×2): qty 1

## 2015-06-01 MED ORDER — PANTOPRAZOLE SODIUM 40 MG PO TBEC
40.0000 mg | DELAYED_RELEASE_TABLET | Freq: Every day | ORAL | Status: DC
  Administered 2015-06-02: 40 mg via ORAL
  Filled 2015-06-01: qty 1

## 2015-06-01 MED ORDER — ACETAMINOPHEN 650 MG RE SUPP: 650.0000 mg | Freq: Four times a day (QID) | RECTAL | Status: DC | PRN

## 2015-06-01 MED ORDER — SODIUM CHLORIDE 0.9 % IV SOLN: 250.0000 mL | INTRAVENOUS | Status: DC | PRN

## 2015-06-01 MED ORDER — ONDANSETRON HCL 4 MG PO TABS: 4.0000 mg | ORAL_TABLET | Freq: Four times a day (QID) | ORAL | Status: DC | PRN

## 2015-06-01 MED ORDER — HYDROCODONE-ACETAMINOPHEN 5-325 MG PO TABS
1.0000 | ORAL_TABLET | ORAL | Status: DC | PRN
  Administered 2015-06-01: 2 via ORAL
  Filled 2015-06-01: qty 2

## 2015-06-01 MED ORDER — SODIUM CHLORIDE 0.9% FLUSH
3.0000 mL | INTRAVENOUS | Status: DC | PRN
  Administered 2015-06-01: 3 mL via INTRAVENOUS
  Filled 2015-06-01: qty 3

## 2015-06-01 MED ORDER — HYDROMORPHONE HCL 1 MG/ML IJ SOLN
INTRAMUSCULAR | Status: AC
  Administered 2015-06-01: 0.5 mg via INTRAVENOUS
  Filled 2015-06-01: qty 1

## 2015-06-01 MED ORDER — SIMVASTATIN 40 MG PO TABS
40.0000 mg | ORAL_TABLET | Freq: Every evening | ORAL | Status: DC
  Administered 2015-06-01: 40 mg via ORAL
  Filled 2015-06-01: qty 1

## 2015-06-01 MED ORDER — ACETAMINOPHEN 325 MG PO TABS
650.0000 mg | ORAL_TABLET | Freq: Four times a day (QID) | ORAL | Status: DC | PRN
  Administered 2015-06-02: 650 mg via ORAL
  Filled 2015-06-01: qty 2

## 2015-06-01 MED ORDER — MAGNESIUM OXIDE 400 (241.3 MG) MG PO TABS
400.0000 mg | ORAL_TABLET | ORAL | Status: DC
  Administered 2015-06-02: 400 mg via ORAL
  Filled 2015-06-01: qty 1

## 2015-06-01 MED ORDER — BISACODYL 10 MG RE SUPP
10.0000 mg | Freq: Every day | RECTAL | Status: DC | PRN
  Filled 2015-06-01: qty 1

## 2015-06-01 MED ORDER — ONDANSETRON HCL 4 MG/2ML IJ SOLN
4.0000 mg | Freq: Once | INTRAMUSCULAR | Status: AC
  Administered 2015-06-01: 4 mg via INTRAVENOUS

## 2015-06-01 MED ORDER — ALPRAZOLAM 0.25 MG PO TABS
0.2500 mg | ORAL_TABLET | Freq: Every evening | ORAL | Status: DC | PRN
  Administered 2015-06-01: 0.25 mg via ORAL
  Filled 2015-06-01: qty 1

## 2015-06-01 MED ORDER — DOCUSATE SODIUM 100 MG PO CAPS
100.0000 mg | ORAL_CAPSULE | Freq: Two times a day (BID) | ORAL | Status: DC
Start: 2015-06-01 — End: 2015-06-02
  Administered 2015-06-01: 100 mg via ORAL
  Filled 2015-06-01 (×2): qty 1

## 2015-06-01 MED ORDER — HYDRALAZINE HCL 20 MG/ML IJ SOLN: 10.0000 mg | Freq: Four times a day (QID) | INTRAMUSCULAR | Status: DC | PRN

## 2015-06-01 MED ORDER — SODIUM CHLORIDE 0.9% FLUSH: 3.0000 mL | Freq: Two times a day (BID) | INTRAVENOUS | Status: DC

## 2015-06-01 MED ORDER — IPRATROPIUM-ALBUTEROL 0.5-2.5 (3) MG/3ML IN SOLN: 3.0000 mL | RESPIRATORY_TRACT | Status: DC | PRN

## 2015-06-01 MED ORDER — POLYETHYLENE GLYCOL 3350 17 G PO PACK: 17.0000 g | PACK | Freq: Every day | ORAL | Status: DC | PRN

## 2015-06-01 MED ORDER — ONDANSETRON HCL 4 MG/2ML IJ SOLN
INTRAMUSCULAR | Status: AC
  Administered 2015-06-01: 4 mg via INTRAVENOUS
  Filled 2015-06-01: qty 2

## 2015-06-01 MED ORDER — ONDANSETRON HCL 4 MG/2ML IJ SOLN: 4.0000 mg | Freq: Four times a day (QID) | INTRAMUSCULAR | Status: DC | PRN

## 2015-06-01 MED ORDER — HYDROMORPHONE HCL 1 MG/ML IJ SOLN
0.5000 mg | Freq: Once | INTRAMUSCULAR | Status: AC
  Administered 2015-06-01: 0.5 mg via INTRAVENOUS

## 2015-06-01 MED ORDER — SODIUM CHLORIDE 0.9 % IV SOLN
INTRAVENOUS | Status: DC
  Administered 2015-06-01: 22:00:00 via INTRAVENOUS

## 2015-06-01 MED ORDER — NIFEDIPINE ER OSMOTIC RELEASE 30 MG PO TB24
30.0000 mg | ORAL_TABLET | ORAL | Status: DC
  Administered 2015-06-02: 30 mg via ORAL
  Filled 2015-06-01: qty 1

## 2015-06-01 NOTE — H&P (Signed)
Deborah Poole at [place] NAME: Deborah Poole    MR#:  [id number]  DATE OF BIRTH:  05-Jun-1935  DATE OF ADMISSION:  06/01/2015  PRIMARY CARE PHYSICIAN: Marden Noble, MD   REQUESTING/REFERRING PHYSICIAN: Dr. Cinda Quest  CHIEF COMPLAINT:   Chief Complaint  Patient presents with  . Hip Pain    HISTORY OF PRESENT ILLNESS:  Deborah Poole  is a 80 y.o. female with a known history of COPD, CHF, hypertension, CKD stage III presents to the emergency room complaining of right hip pain of 3 days. This has caused significant problem with walking where patient is unable to bear weight. She had a right hip replacement 10 years back. She does have mild on and off pain but this is much worse. X-ray showed no fractures. A CT scan of the abdomen was checked and this showed degenerative changes and some simple cysts. Possible mild acute sigmoid diverticulitis. Patient has no diarrhea, abdominal pain, nausea or vomiting. No recent falls. Blood work showed acute renal failure and patient is being admitted. Patient was recently in the emergency room 5 days back for some right jaw and neck pain and was discharged home on pain medications. She mentions that she took excess pills and was doped up because of the pain medications and could have had decreased oral intake.  Follows as outpatient with Dr. Juleen China.  PAST MEDICAL HISTORY:   Past Medical History  Diagnosis Date  . Thyroid disease   . CHF (congestive heart failure) ([place])   . Cataract   . Breast cancer ([place])   . Heart attack ([place])   . COPD (chronic obstructive pulmonary disease) ([place])   . Irritable bowel syndrome (IBS)   . Hypertension     PAST SURGICAL HISTORY:   Past Surgical History  Procedure Laterality Date  . Cholecystectomy    . Breast surgery    . Cardiac surgery    . Eye surgery    . Stent placement vascular ([facility] hx)    . Rotator cuff repair    . Abdominal hysterectomy      SOCIAL  HISTORY:   Social History  Substance Use Topics  . Smoking status: Former Research scientist (life sciences)  . Smokeless tobacco: Not on file  . Alcohol Use: 0.0 oz/week    0 Standard drinks or equivalent per week     Comment: once a month or so     FAMILY HISTORY:   Family History  Problem Relation Age of Onset  . Hypertension Other     DRUG ALLERGIES:  No Known Allergies  REVIEW OF SYSTEMS:   Review of Systems  Constitutional: Positive for malaise/fatigue. Negative for fever, chills and weight loss.  HENT: Negative for hearing loss and nosebleeds.   Eyes: Negative for blurred vision, double vision and pain.  Respiratory: Negative for cough, hemoptysis, sputum production, shortness of breath and wheezing.   Cardiovascular: Negative for chest pain, palpitations, orthopnea and leg swelling.  Gastrointestinal: Negative for nausea, vomiting, abdominal pain, diarrhea and constipation.  Genitourinary: Negative for dysuria and hematuria.  Musculoskeletal: Positive for back pain, joint pain and falls. Negative for myalgias.  Skin: Negative for rash.  Neurological: Positive for weakness. Negative for dizziness, tremors, sensory change, speech change, focal weakness, seizures and headaches.  Endo/Heme/Allergies: Does not bruise/bleed easily.  Psychiatric/Behavioral: Negative for depression and memory loss. The patient is not nervous/anxious.     MEDICATIONS AT HOME:   Prior to Admission medications   Medication  Sig Start Date End Date Taking? Authorizing Provider  ALPRAZolam Duanne Moron) 0.25 MG tablet Take 0.25 mg by mouth at bedtime as needed for sleep.    Historical Provider, MD  carvedilol (COREG) 3.125 MG tablet Take 1 tablet by mouth 2 (two) times daily.    Historical Provider, MD  cholecalciferol (VITAMIN D) 1000 units tablet Take 1,000 Units by mouth daily.    Historical Provider, MD  docusate sodium (COLACE) 100 MG capsule Take 1 tablet once or twice daily as needed for constipation while taking  narcotic pain medicine 04/29/15   Hinda Kehr, MD  HYDROcodone-acetaminophen (NORCO/VICODIN) 5-325 MG tablet Take 1-2 tablets by mouth every 4 (four) hours as needed for moderate pain. 04/29/15   Hinda Kehr, MD  omeprazole (PRILOSEC) 10 MG capsule Take 10 mg by mouth daily.    Historical Provider, MD  simvastatin (ZOCOR) 40 MG tablet Take 40 mg by mouth every evening.    Historical Provider, MD  spironolactone (ALDACTONE) 25 MG tablet Take 25 mg by mouth daily.    Historical Provider, MD     VITAL SIGNS:  Blood pressure 159/97, pulse 81, temperature 97.9 F (36.6 C), temperature source Oral, resp. rate 18, height 5\' 1"  (1.549 m), weight 99.791 kg (220 lb), SpO2 92 %.  PHYSICAL EXAMINATION:  Physical Exam  GENERAL:  80 y.o.-year-old patient lying in the bed with no acute distress. Obese EYES: Pupils equal, round, reactive to light and accommodation. No scleral icterus. Extraocular muscles intact.  HEENT: Head atraumatic, normocephalic. Oropharynx and nasopharynx clear. No oropharyngeal erythema, moist oral mucosa  NECK:  Supple, no jugular venous distention. No thyroid enlargement, no tenderness.  LUNGS: Normal breath sounds bilaterally, no wheezing, rales, rhonchi. No use of accessory muscles of respiration.  CARDIOVASCULAR: S1, S2 normal. No murmurs, rubs, or gallops.  ABDOMEN: Soft, nontender, nondistended. Bowel sounds present. No organomegaly or mass.  EXTREMITIES: No pedal edema, cyanosis, or clubbing. + 2 pedal & radial pulses b/l.  Pain in right hip with passive range of motion. No point tenderness found. NEUROLOGIC: Cranial nerves II through XII are intact. No focal Motor or sensory deficits appreciated b/l PSYCHIATRIC: The patient is alert and oriented x 3. Good affect.  SKIN: No obvious rash, lesion, or ulcer.   LABORATORY PANEL:   CBC  Recent Labs Lab 06/01/15 1555  WBC 9.5  HGB 13.8  HCT 42.3  PLT 270    ------------------------------------------------------------------------------------------------------------------  Chemistries   Recent Labs Lab 06/01/15 1555  NA 136  K 4.8  CL 103  CO2 21*  GLUCOSE 91  BUN 66*  CREATININE 2.84*  CALCIUM 9.7   ------------------------------------------------------------------------------------------------------------------  Cardiac Enzymes No results for input(s): TROPONINI in the last 168 hours. ------------------------------------------------------------------------------------------------------------------  RADIOLOGY:  Ct Renal Stone Study  06/01/2015  CLINICAL DATA:  Hip pain.  Hip prosthesis.  Popping sensation. EXAM: CT ABDOMEN AND PELVIS WITHOUT CONTRAST TECHNIQUE: Multidetector CT imaging of the abdomen and pelvis was performed following the standard protocol without IV contrast. COMPARISON:  06/01/2015 FINDINGS: Lower chest: Subsegmental atelectasis or scarring in the posterior basal segments of both lower lobes and anteriorly in the left lower lobe. There is scattered calcified granulomas in both lower lobes. Mild cardiomegaly is present with coronary artery atherosclerotic calcification. Small type 1 hiatal hernia. Hepatobiliary: Punctate calcifications in the liver compatible with remote granulomatous process. Cholecystectomy. Pancreas: Unremarkable Spleen: Punctate calcifications in the spleen compatible with remote granulomatous process. Adrenals/Urinary Tract: 1.3 by 1.9 cm right adrenal mass, internal density 9 Hounsfield units, compatible with  adenoma. Bilateral exophytic renal lesions. In the left mid kidney a lateral lesion measuring 1.3 cm in diameter has internal density of 29 Hounsfield units (complex). In the right kidney lower pole there are several fluid density lesions in addition to a 1.4 cm lesion with internal density 13 Hounsfield units. Another right kidney lower pole lesion posterolaterally measures 1.3 cm and has an  internal density of 10 Hounsfield units. A simple cystic lesion of the right kidney lower pole measures 5 Hounsfield units in density. There several stones in the vicinity of the right distal ureter which are not thought to be within the ureter itself. Admittedly parts of the ureter are obscured by streak artifact from the adjacent right hip hip implant. The bladder is empty. There vascular calcifications along both renal hila. No definite stones. Stomach/Bowel: Appendix normal. Sigmoid diverticulosis with faint stranding along the sigmoid mesentery suspicious for low-grade acute diverticulitis. This has shown on the coronal images such as images 97 through 81 of series 5. There is also diverticulosis of the descending colon. Vascular/Lymphatic: Aortoiliac atherosclerotic vascular disease. Reproductive: Left ovarian remnant 2.0 by 2.1 cm. Uterus absent. Right ovary not seen. Other: No supplemental non-categorized findings. Musculoskeletal: No specific complicating feature having to do with visualized portion of the right hip prosthesis. Lumbar spondylosis and degenerative disc disease with grade 1 anterolisthesis at L4-5. Disc uncovering, disc bulge, and facet arthropathy cause mild bilateral foraminal stenosis and at least mild central narrowing of the thecal sac at this level. There is also more striking foraminal impingement bilaterally at L5-S1 primarily due to the intervertebral spurring. IMPRESSION: 1. Acute mild sigmoid colon diverticulitis. 2. I do not observe an acute abnormality specifically having to do with the right hip prosthesis. The distal margin of the stem was not included on today's exam, but appeared normal on earlier conventional radiography. 3. Lumbar spondylosis and degenerative disc disease with prominent bilateral foraminal impingement at L5-S1 and mild impingement at L4-5. 4. Bilateral renal exophytic lesions, several of which appear to be complex. Differential diagnostic considerations  include complex renal cysts or small renal cell carcinoma. Given the lack of prior renal protocol MRI with and without contrast for further workup in the non emergent setting. Cross-sectional imaging, consider cardiomegaly with coronary atherosclerosis. 5. Old granulomatous disease. 6. Small right adrenal adenoma. 7. Small type 1 hiatal hernia. Electronically Signed   By: Van Clines M.D.   On: 06/01/2015 17:57   Dg Hip Unilat  With Pelvis 2-3 Views Right  06/01/2015  CLINICAL DATA:  Difficulty ambulating with right hip pain, initial encounter EXAM: DG HIP (WITH OR WITHOUT PELVIS) 2-3V RIGHT COMPARISON:  None. FINDINGS: Right hip prosthesis is noted. No dislocation is identified. No apparent loosening or other bony abnormality is seen. No soft tissue changes are noted. IMPRESSION: Status post right hip replacement without acute abnormality. Electronically Signed   By: Inez Catalina M.D.   On: 06/01/2015 15:18     IMPRESSION AND PLAN:   * ARF over Chronic kidney disease stage III Start IVF Likely due to decreased intake Repeat labs in AM NO obstruction on CT abd  * Right hip pain Likely from arthritis. No fracture or dislocation on x-ray Pain medications added. Consult physical therapy.  * Hypertension Continue Coreg. Hold Aldactone.  *  COPD   patient has descended into the 38s in the emergency room. She had similar issue with temporary desaturations into the 80s during her recent trip to the emergency room. She likely will need home  oxygen. Recently will keep her on 2 L oxygen. Likely has chronic respiratory failure.  * DVT prophylaxis with heparin  All the records are reviewed and case discussed with ED provider. Management plans discussed with the patient, family and they are in agreement.  CODE STATUS: FULL  TOTAL TIME TAKING CARE OF THIS PATIENT: 40 minutes.   Hillary Bow R M.D on 06/01/2015 at 6:52 PM  Between 7am to 6pm - Pager - [phone]  After 6pm go to  [email or web address] - password [email or web address] [facility]  Office  [phone]  CC: Primary care physician; Marden Noble, MD  Note: This dictation was prepared with Dragon dictation along with smaller phrase technology. Any transcriptional errors that result from this process are unintentional.

## 2015-06-01 NOTE — ED Provider Notes (Addendum)
[facility] Emergency Department Provider Note  ____________________________________________  Time seen: Approximately 4:13 PM  I have reviewed the triage vital signs and the nursing notes.   HISTORY  Chief Complaint Hip Pain    HPI Deborah Poole is a 80 y.o. female patient reports she's been having a little bit of low back pain which radiates from the middle of her back to each buttock but no further. That's really not been bothering her much but then 2 days ago she was getting out of bed in the morning she began having a very bad pain in the right hip. She is taking some oxycodone 2.5 for that but really isn't helping in it hurts a lot to put weight on it. Patient denies any fever chills nausea vomiting patient does have some pain in the quadriceps on both sides patient is able to move but putting weight on the hip or sitting up hurts a lot. There is no swelling in the area and she does not remember hurting it although his popped occasionally. The hips been in place for about 10 years was placed in [place].   Past Medical History  Diagnosis Date  . Thyroid disease   . CHF (congestive heart failure) ([place])   . Cataract   . Breast cancer ([place])   . Heart attack ([place])   . COPD (chronic obstructive pulmonary disease) ([place])   . Irritable bowel syndrome (IBS)   . Hypertension     There are no active problems to display for this patient.   Past Surgical History  Procedure Laterality Date  . Cholecystectomy    . Breast surgery    . Cardiac surgery    . Eye surgery    . Stent placement vascular ([facility] hx)    . Rotator cuff repair    . Abdominal hysterectomy      Current Outpatient Rx  Name  Route  Sig  Dispense  Refill  . ALPRAZolam (XANAX) 0.25 MG tablet   Oral   Take 0.25 mg by mouth at bedtime as needed for sleep.         . carvedilol (COREG) 3.125 MG tablet   Oral   Take 1 tablet by mouth 2 (two) times daily.         . cholecalciferol (VITAMIN D)  1000 units tablet   Oral   Take 1,000 Units by mouth daily.         Marland Kitchen docusate sodium (COLACE) 100 MG capsule      Take 1 tablet once or twice daily as needed for constipation while taking narcotic pain medicine   30 capsule   0   . HYDROcodone-acetaminophen (NORCO/VICODIN) 5-325 MG tablet   Oral   Take 1-2 tablets by mouth every 4 (four) hours as needed for moderate pain.   20 tablet   0   . omeprazole (PRILOSEC) 10 MG capsule   Oral   Take 10 mg by mouth daily.         . simvastatin (ZOCOR) 40 MG tablet   Oral   Take 40 mg by mouth every evening.         Marland Kitchen spironolactone (ALDACTONE) 25 MG tablet   Oral   Take 25 mg by mouth daily.           Allergies Review of patient's allergies indicates no known allergies.  No family history on file.  Social History Social History  Substance Use Topics  . Smoking status: Former Research scientist (life sciences)  .  Smokeless tobacco: None  . Alcohol Use: Yes     Comment: once a month or so     Review of Systems Constitutional: No fever/chills Eyes: No visual changes. ENT: No sore throat. Cardiovascular: Denies chest pain. Respiratory: Denies shortness of breath. Gastrointestinal: No abdominal pain.  No nausea, no vomiting.  No diarrhea.  No constipation. Genitourinary: Negative for dysuria. Musculoskeletal: Negative for back pain. Skin: Negative for rash. Neurological: Negative for headaches, focal weakness or numbness.  10-point ROS otherwise negative.  ____________________________________________   PHYSICAL EXAM:  VITAL SIGNS: ED Triage Vitals  Enc Vitals Group     BP 06/01/15 1249 159/97 mmHg     Pulse Rate 06/01/15 1249 90     Resp 06/01/15 1249 18     Temp 06/01/15 1249 97.9 F (36.6 C)     Temp Source 06/01/15 1249 Oral     SpO2 06/01/15 1249 88 %     Weight 06/01/15 1244 220 lb (99.791 kg)     Height 06/01/15 1244 5\' 1"  (1.549 m)     Head Cir --      Peak Flow --      Pain Score --      Pain Loc --      Pain Edu?  --      Excl. in [place]? --     Constitutional: Alert and oriented. Well appearing and in no acute distress. Eyes: Conjunctivae are normal. PERRL. EOMI. Head: Atraumatic. Nose: No congestion/rhinnorhea. Mouth/Throat: Mucous membranes are moist.  Oropharynx non-erythematous. Neck: No stridor.   Cardiovascular: Normal rate, regular rhythm. Grossly normal heart sounds.  Good peripheral circulation. Respiratory: Normal respiratory effort.  No retractions. Lungs CTAB. Gastrointestinal: Soft and nontender. No distention. No abdominal bruits. No CVA tenderness. Musculoskeletal: No lower extremity tenderness nor edema.  No joint effusions. Neurologic:  Normal speech and language. No gross focal neurologic deficits are appreciated. No gait instability. Skin:  Skin is warm, dry and intact. No rash noted. Psychiatric: Mood and affect are normal. Speech and behavior are normal.  ____________________________________________   LABS (all labs ordered are listed, but only abnormal results are displayed)  Labs Reviewed  BASIC METABOLIC PANEL - Abnormal; Notable for the following:    CO2 21 (*)    BUN 66 (*)    Creatinine, Ser 2.84 (*)    GFR calc non Af Amer 15 (*)    GFR calc Af Amer 17 (*)    All other components within normal limits  CBC WITH DIFFERENTIAL/PLATELET - Abnormal; Notable for the following:    Neutro Abs 7.1 (*)    All other components within normal limits   ____________________________________________  EKG   ____________________________________________  RADIOLOGY hip and pelvis read by radiology as no acute disease with ____________________________________________   PROCEDURES    ____________________________________________   INITIAL IMPRESSION / ASSESSMENT AND PLAN / ED COURSE  Pertinent labs & imaging results that were available during my care of the patient were reviewed by me and considered in my medical decision making (see chart for  details).   ____________________________________________   FINAL CLINICAL IMPRESSION(S) / ED DIAGNOSES  Final diagnoses:  Pain  Acute renal failure, unspecified acute renal failure type [facility])      Nena Polio, MD 06/01/15 1830  CT read and interpreted by radiology showed no apparent etiology for the renal failure there is some  Nena Polio, MD 06/01/15 [phone]

## 2015-06-01 NOTE — ED Notes (Signed)
Pt states she had a right hip replacement years ago, states it has been "popping" out of place occasionally from what she can tell, this am, unable to walk, in "excruciating" pain. Took her oxycodone at home with very little relief.

## 2015-06-02 LAB — BASIC METABOLIC PANEL
ANION GAP: 8 (ref 5–15)
BUN: 67 mg/dL — AB (ref 6–20)
CALCIUM: 9.1 mg/dL (ref 8.9–10.3)
CO2: 22 mmol/L (ref 22–32)
Chloride: 106 mmol/L (ref 101–111)
Creatinine, Ser: 2.48 mg/dL — ABNORMAL HIGH (ref 0.44–1.00)
GFR calc Af Amer: 20 mL/min — ABNORMAL LOW (ref >60)
GFR, EST NON AFRICAN AMERICAN: 18 mL/min — AB (ref >60)
GLUCOSE: 88 mg/dL (ref 65–99)
Potassium: 4.8 mmol/L (ref 3.5–5.1)
Sodium: 136 mmol/L (ref 135–145)

## 2015-06-02 MED ORDER — MORPHINE SULFATE (PF) 2 MG/ML IV SOLN
2.0000 mg | Freq: Four times a day (QID) | INTRAVENOUS | Status: DC | PRN
  Administered 2015-06-02 (×2): 2 mg via INTRAVENOUS
  Filled 2015-06-02 (×2): qty 1

## 2015-06-02 MED ORDER — OXYCODONE-ACETAMINOPHEN 2.5-325 MG PO TABS: 1.0000 | ORAL_TABLET | ORAL | Status: DC | PRN

## 2015-06-02 NOTE — Progress Notes (Deleted)
[id number]: 82% saturation on room air 0930: 92% on [facility]

## 2015-06-02 NOTE — Progress Notes (Signed)
0929: 82% saturation on room air 0930: 92% on 2LNC at rest 0932: 87% on 2L with exertion 0934: 94% on 3L at rest 0936: 94% on 3L with exertion  Patient walked around nursing station with walker.

## 2015-06-02 NOTE — Discharge Summary (Addendum)
Deborah Poole, is a 80 y.o. female  DOB 10-Dec-1935  MRN [id number].  Admission date:  06/01/2015  Admitting Physician  Hillary Bow, MD  Discharge Date:  06/02/2015   Primary MD  Marden Noble, MD  Recommendations for primary care physician for things to follow:    follow Up with primary doctor in 1 week   Admission Diagnosis  Pain [R52] Acute renal failure, unspecified acute renal failure type [facility]) [N17.9]   Discharge Diagnosis  Pain [R52] Acute renal failure, unspecified acute renal failure type ([place]) [N17.9]    Active Problems:   ARF (acute renal failure) ([place])      Past Medical History  Diagnosis Date  . Thyroid disease   . CHF (congestive heart failure) ([place])   . Cataract   . Breast cancer ([place])   . Heart attack ([place])   . COPD (chronic obstructive pulmonary disease) ([place])   . Irritable bowel syndrome (IBS)   . Hypertension     Past Surgical History  Procedure Laterality Date  . Cholecystectomy    . Breast surgery    . Cardiac surgery    . Eye surgery    . Stent placement vascular ([facility])    . Rotator cuff repair    . Abdominal hysterectomy         History of present illness and  Hospital Course:     Kindly see H&P for history of present illness and admission details, please review complete Labs, Consult reports and Test reports for all details in brief  HPI  from the history and physical done on the day of admission 80 year old female patient with history of COPD, congestive heart failure, hypertension, chronic kidney disease stage III complains of right hip pain and admitted for the same. Patient had right hip replacement 10 years back. No history of fall. Patient also found to have acute on chronic renal failure. So she is admitted for acute on chronic renal failure, right hip  pain.   Hospital Course  #1 acute renal failure due to ATN: Due to poor by mouth intake. Patient renal function improved and back to baseline with IV hydration. Patient can follow up with [facility] from nephrology as an outpatient. #2 right hip pain due to arthritis: X-ray of the right hip did not show any fracture. Patient can see [facility] ortho as an outpatient for right hip pain. Continue pain meds. #3 mild acute sigmoid diverticulitis: Patient has no abdominal pain or diarrhea and nausea vomiting: Monitor clinically at this time , no further treatment needed at this time. 4. COPD with hypoxia patient saturations are around 88%: O2 saturation dropped to 82%on room air while ambulating and also at rest. She needed 3 L of oxygen. So we will discharge her on now 3 L of oxygen. 5 back pain secondary to degenerative joint disease. Continue pain medication.     Discharge Condition: Stable.   Follow UP   follow up with primary doctor in 1 week   Discharge Instructions  and  Discharge Medications        Medication List    STOP taking these medications        ibuprofen 200 MG tablet  Commonly known as:  ADVIL,MOTRIN      TAKE these medications        albuterol 108 (90 Base) MCG/ACT inhaler  Commonly known as:  PROVENTIL HFA;VENTOLIN HFA  Inhale 2 puffs into the lungs every 6 (six) hours as needed for wheezing  or shortness of breath.     ALPRAZolam 0.25 MG tablet  Commonly known as:  XANAX  Take 0.25-0.5 mg by mouth at bedtime as needed for anxiety or sleep.     carvedilol 3.125 MG tablet  Commonly known as:  COREG  Take 1 tablet by mouth 2 (two) times daily.     cholecalciferol 1000 units tablet  Commonly known as:  VITAMIN D  Take 1,000 Units by mouth daily.     Magnesium 250 MG Tabs  Take 250 mg by mouth every morning.     NIFEdipine 30 MG 24 hr tablet  Commonly known as:  PROCARDIA-XL/ADALAT CC  Take 30 mg by mouth every morning.     omeprazole 10 MG capsule   Commonly known as:  PRILOSEC  Take 10 mg by mouth every morning.     oxycodone-acetaminophen 2.5-325 MG tablet  Commonly known as:  PERCOCET  Take 1 tablet by mouth every 4 (four) hours as needed for pain.     oyster calcium 500 MG Tabs tablet  Take 500 mg of elemental calcium by mouth every morning.     simvastatin 40 MG tablet  Commonly known as:  ZOCOR  Take 40 mg by mouth every morning.     spironolactone 25 MG tablet  Commonly known as:  ALDACTONE  Take 25 mg by mouth every morning.          Diet and Activity recommendation: See Discharge Instructions above   Consults obtained - none   Major procedures and Radiology Reports - PLEASE review detailed and final reports for all details, in brief -      Ct Renal Stone Study  06/01/2015  CLINICAL DATA:  Hip pain.  Hip prosthesis.  Popping sensation. EXAM: CT ABDOMEN AND PELVIS WITHOUT CONTRAST TECHNIQUE: Multidetector CT imaging of the abdomen and pelvis was performed following the standard protocol without IV contrast. COMPARISON:  06/01/2015 FINDINGS: Lower chest: Subsegmental atelectasis or scarring in the posterior basal segments of both lower lobes and anteriorly in the left lower lobe. There is scattered calcified granulomas in both lower lobes. Mild cardiomegaly is present with coronary artery atherosclerotic calcification. Small type 1 hiatal hernia. Hepatobiliary: Punctate calcifications in the liver compatible with remote granulomatous process. Cholecystectomy. Pancreas: Unremarkable Spleen: Punctate calcifications in the spleen compatible with remote granulomatous process. Adrenals/Urinary Tract: 1.3 by 1.9 cm right adrenal mass, internal density 9 Hounsfield units, compatible with adenoma. Bilateral exophytic renal lesions. In the left mid kidney a lateral lesion measuring 1.3 cm in diameter has internal density of 29 Hounsfield units (complex). In the right kidney lower pole there are several fluid density lesions in  addition to a 1.4 cm lesion with internal density 13 Hounsfield units. Another right kidney lower pole lesion posterolaterally measures 1.3 cm and has an internal density of 10 Hounsfield units. A simple cystic lesion of the right kidney lower pole measures 5 Hounsfield units in density. There several stones in the vicinity of the right distal ureter which are not thought to be within the ureter itself. Admittedly parts of the ureter are obscured by streak artifact from the adjacent right hip hip implant. The bladder is empty. There vascular calcifications along both renal hila. No definite stones. Stomach/Bowel: Appendix normal. Sigmoid diverticulosis with faint stranding along the sigmoid mesentery suspicious for low-grade acute diverticulitis. This has shown on the coronal images such as images 97 through 81 of series 5. There is also diverticulosis of the descending colon. Vascular/Lymphatic: Aortoiliac atherosclerotic vascular  disease. Reproductive: Left ovarian remnant 2.0 by 2.1 cm. Uterus absent. Right ovary not seen. Other: No supplemental non-categorized findings. Musculoskeletal: No specific complicating feature having to do with visualized portion of the right hip prosthesis. Lumbar spondylosis and degenerative disc disease with grade 1 anterolisthesis at L4-5. Disc uncovering, disc bulge, and facet arthropathy cause mild bilateral foraminal stenosis and at least mild central narrowing of the thecal sac at this level. There is also more striking foraminal impingement bilaterally at L5-S1 primarily due to the intervertebral spurring. IMPRESSION: 1. Acute mild sigmoid colon diverticulitis. 2. I do not observe an acute abnormality specifically having to do with the right hip prosthesis. The distal margin of the stem was not included on today's exam, but appeared normal on earlier conventional radiography. 3. Lumbar spondylosis and degenerative disc disease with prominent bilateral foraminal impingement at  L5-S1 and mild impingement at L4-5. 4. Bilateral renal exophytic lesions, several of which appear to be complex. Differential diagnostic considerations include complex renal cysts or small renal cell carcinoma. Given the lack of prior renal protocol MRI with and without contrast for further workup in the non emergent setting. Cross-sectional imaging, consider cardiomegaly with coronary atherosclerosis. 5. Old granulomatous disease. 6. Small right adrenal adenoma. 7. Small type 1 hiatal hernia. Electronically Signed   By: Van Clines M.D.   On: 06/01/2015 17:57   Dg Hip Unilat  With Pelvis 2-3 Views Right  06/01/2015  CLINICAL DATA:  Difficulty ambulating with right hip pain, initial encounter EXAM: DG HIP (WITH OR WITHOUT PELVIS) 2-3V RIGHT COMPARISON:  None. FINDINGS: Right hip prosthesis is noted. No dislocation is identified. No apparent loosening or other bony abnormality is seen. No soft tissue changes are noted. IMPRESSION: Status post right hip replacement without acute abnormality. Electronically Signed   By: Inez Catalina M.D.   On: 06/01/2015 15:18    Micro Results    No results found for this or any previous visit (from the past 240 hour(s)).     Today   Subjective:   Deborah Poole today has no headache,no chest abdominal pain,no new weakness tingling or numbness, feels much better wants to go home today.   Objective:   Blood pressure 138/66, pulse 78, temperature 97.7 F (36.5 C), temperature source Axillary, resp. rate 20, height 5\' 1"  (1.549 m), weight 99.909 kg (220 lb 4.2 oz), SpO2 82 %.   Intake/Output Summary (Last 24 hours) at 06/02/15 0936 Last data filed at 06/01/15 2217  Gross per 24 hour  Intake      3 ml  Output      0 ml  Net      3 ml    Exam Awake Alert, Oriented x 3, No new F.N deficits, Normal affect [place].AT,PERRAL Supple Neck,No JVD, No cervical lymphadenopathy appriciated.  Symmetrical Chest wall movement, Good air movement bilaterally, CTAB RRR,No  Gallops,Rubs or new Murmurs, No Parasternal Heave +ve B.Sounds, Abd Soft, Non tender, No organomegaly appriciated, No rebound -guarding or rigidity. No Cyanosis, Clubbing or edema, No new Rash or bruise  Data Review   CBC w Diff: Lab Results  Component Value Date   WBC 9.5 06/01/2015   WBC 10.1 03/05/2012   HGB 13.8 06/01/2015   HGB 13.0 03/05/2012   HCT 42.3 06/01/2015   HCT 39.7 03/05/2012   PLT 270 06/01/2015   PLT 251 03/05/2012   LYMPHOPCT 15 06/01/2015   MONOPCT 7 06/01/2015   EOSPCT 2 06/01/2015   BASOPCT 1 06/01/2015    CMP: Lab  Results  Component Value Date   NA 136 06/02/2015   NA 140 03/05/2012   K 4.8 06/02/2015   K 4.3 03/05/2012   CL 106 06/02/2015   CL 108* 03/05/2012   CO2 22 06/02/2015   CO2 24 03/05/2012   BUN 67* 06/02/2015   BUN 44* 03/05/2012   CREATININE 2.48* 06/02/2015   CREATININE 1.74* 03/05/2012   PROT 7.6 04/29/2015   ALBUMIN 3.4* 04/29/2015   BILITOT 0.3 04/29/2015   ALKPHOS 63 04/29/2015   AST 15 04/29/2015   ALT 13* 04/29/2015  .   Total Time in preparing paper work, data evaluation and todays exam - 66 minutes  Deborah Poole M.D on 06/02/2015 at 9:36 AM    Note: This dictation was prepared with Dragon dictation along with smaller phrase technology. Any transcriptional errors that result from this process are unintentional.

## 2015-06-02 NOTE — Progress Notes (Signed)
Pt requesting pain medication.  Norco is ordered but it causes her to itch. New order received for Morphine 2 mg IV every 6 hours

## 2015-06-02 NOTE — Progress Notes (Signed)
   06/02/15 1115  Clinical Encounter Type  Visited With Patient  Visit Type Initial  Referral From Nurse  Consult/Referral To Chaplain  Spiritual Encounters  Spiritual Needs Literature  Stress Factors  Patient Stress Factors Health changes  Advance Directives (For Healthcare)  Does patient have an advance directive? No  Would patient like information on creating an advanced directive? Yes - Scientist, clinical (histocompatibility and immunogenetics) given  Provided AD education & materials at patient's request. Patient intends to take documents home and complete there.  Chap. Ysidra Sopher G. [place]

## 2015-06-02 NOTE — Progress Notes (Signed)
SATURATION QUALIFICATIONS: (This note is used to comply with regulatory documentation for home oxygen)  Patient Saturations on Room Air at Rest = 82%  Patient Saturations on Room Air while Ambulating = 82%   Patient Saturations on 3 Liters of oxygen while Ambulating = 94%  Please briefly explain why patient needs home oxygen: COPD

## 2015-06-02 NOTE — Care Management Note (Signed)
Case Management Note  Patient Details  Name: Deborah Poole MRN: [id number] Date of Birth: April 10, 1935  Subjective/Objective:                  Met with patient to discuss discharge planning. Her PCP is Dr. Brynda Greathouse. She is new to O2. She lives with her husband that has mild dementia. She denies being homebound. Her husband has used [facility] however patient denies need for home health. She ambulates with a walker/rollator.  Action/Plan:  Patient qualifies for home O2. Referral made to [place] care. No further RNCM needs. Case closed.   Expected Discharge Date:                  Expected Discharge Plan:     In-House Referral:     Discharge planning Services  CM Consult  Post Acute Care Choice:  Home Health, Durable Medical Equipment Choice offered to:  Patient  DME Arranged:  Oxygen DME Agency:  [place]:    [place] Agency:     Status of Service:  In process, will continue to follow  Medicare Important Message Given:    Date Medicare IM Given:    Medicare IM give by:    Date Additional Medicare IM Given:    Additional Medicare Important Message give by:     If discussed at [place] of Stay Meetings, dates discussed:    Additional Comments:  Marshell Garfinkel, RN 06/02/2015, 11:01 AM

## 2015-06-02 NOTE — Progress Notes (Signed)
Patient alert and oriented, VSS, some chronic pain in hip and back.   Follow up in 1 week with PCP.  Removed PIV.  Rx for percocet.    Planned to be escorted out of hospital via wheelchair by nursing staff with oxygen.  3L

## 2015-06-06 DIAGNOSIS — I259 Chronic ischemic heart disease, unspecified: Secondary | ICD-10-CM | POA: Diagnosis not present

## 2015-06-06 DIAGNOSIS — I129 Hypertensive chronic kidney disease with stage 1 through stage 4 chronic kidney disease, or unspecified chronic kidney disease: Secondary | ICD-10-CM | POA: Diagnosis not present

## 2015-06-26 DIAGNOSIS — K5900 Constipation, unspecified: Secondary | ICD-10-CM | POA: Diagnosis not present

## 2015-07-05 DIAGNOSIS — M5136 Other intervertebral disc degeneration, lumbar region: Secondary | ICD-10-CM | POA: Diagnosis not present

## 2015-07-05 DIAGNOSIS — M545 Low back pain: Secondary | ICD-10-CM | POA: Diagnosis not present

## 2015-07-05 DIAGNOSIS — M5416 Radiculopathy, lumbar region: Secondary | ICD-10-CM | POA: Diagnosis not present

## 2015-07-11 DIAGNOSIS — M4607 Spinal enthesopathy, lumbosacral region: Secondary | ICD-10-CM | POA: Diagnosis not present

## 2015-07-11 DIAGNOSIS — M9903 Segmental and somatic dysfunction of lumbar region: Secondary | ICD-10-CM | POA: Diagnosis not present

## 2015-07-11 DIAGNOSIS — M5137 Other intervertebral disc degeneration, lumbosacral region: Secondary | ICD-10-CM | POA: Diagnosis not present

## 2015-07-12 DIAGNOSIS — M5137 Other intervertebral disc degeneration, lumbosacral region: Secondary | ICD-10-CM | POA: Diagnosis not present

## 2015-07-12 DIAGNOSIS — M4607 Spinal enthesopathy, lumbosacral region: Secondary | ICD-10-CM | POA: Diagnosis not present

## 2015-07-12 DIAGNOSIS — M9903 Segmental and somatic dysfunction of lumbar region: Secondary | ICD-10-CM | POA: Diagnosis not present

## 2015-07-14 DIAGNOSIS — M9903 Segmental and somatic dysfunction of lumbar region: Secondary | ICD-10-CM | POA: Diagnosis not present

## 2015-07-14 DIAGNOSIS — M4607 Spinal enthesopathy, lumbosacral region: Secondary | ICD-10-CM | POA: Diagnosis not present

## 2015-07-14 DIAGNOSIS — M5137 Other intervertebral disc degeneration, lumbosacral region: Secondary | ICD-10-CM | POA: Diagnosis not present

## 2015-07-21 ENCOUNTER — Telehealth: Payer: Self-pay | Admitting: *Deleted

## 2015-07-21 NOTE — Telephone Encounter (Signed)
Given pre procedure instructions for the [facility] appointment.

## 2015-07-21 NOTE — Telephone Encounter (Signed)
per Dr. Quita Skye by telephone to add this pt to his schedule for 08/01/15@ 2pm for procedure. i sw pt made her aware that her appt for 08/21/15 has change to 08/01/15. Pt is fine with this appt change. However i do not have override access will give to [place]..TD

## 2015-07-24 DIAGNOSIS — M4607 Spinal enthesopathy, lumbosacral region: Secondary | ICD-10-CM | POA: Diagnosis not present

## 2015-07-24 DIAGNOSIS — M9903 Segmental and somatic dysfunction of lumbar region: Secondary | ICD-10-CM | POA: Diagnosis not present

## 2015-07-24 DIAGNOSIS — M5137 Other intervertebral disc degeneration, lumbosacral region: Secondary | ICD-10-CM | POA: Diagnosis not present

## 2015-07-26 DIAGNOSIS — M4607 Spinal enthesopathy, lumbosacral region: Secondary | ICD-10-CM | POA: Diagnosis not present

## 2015-07-26 DIAGNOSIS — M5137 Other intervertebral disc degeneration, lumbosacral region: Secondary | ICD-10-CM | POA: Diagnosis not present

## 2015-07-26 DIAGNOSIS — M9903 Segmental and somatic dysfunction of lumbar region: Secondary | ICD-10-CM | POA: Diagnosis not present

## 2015-08-01 ENCOUNTER — Encounter: Payer: Self-pay | Admitting: Anesthesiology

## 2015-08-01 ENCOUNTER — Ambulatory Visit: Payer: Medicare Other | Attending: Anesthesiology | Admitting: Anesthesiology

## 2015-08-01 VITALS — BP 144/71 | HR 83 | Temp 98.8°F | Resp 20 | Ht 60.0 in | Wt 204.0 lb

## 2015-08-01 DIAGNOSIS — Z9049 Acquired absence of other specified parts of digestive tract: Secondary | ICD-10-CM | POA: Diagnosis not present

## 2015-08-01 DIAGNOSIS — K219 Gastro-esophageal reflux disease without esophagitis: Secondary | ICD-10-CM | POA: Insufficient documentation

## 2015-08-01 DIAGNOSIS — M5136 Other intervertebral disc degeneration, lumbar region: Secondary | ICD-10-CM | POA: Diagnosis not present

## 2015-08-01 DIAGNOSIS — F419 Anxiety disorder, unspecified: Secondary | ICD-10-CM | POA: Diagnosis not present

## 2015-08-01 DIAGNOSIS — Z87891 Personal history of nicotine dependence: Secondary | ICD-10-CM | POA: Insufficient documentation

## 2015-08-01 DIAGNOSIS — M5432 Sciatica, left side: Secondary | ICD-10-CM | POA: Diagnosis not present

## 2015-08-01 DIAGNOSIS — K589 Irritable bowel syndrome without diarrhea: Secondary | ICD-10-CM | POA: Insufficient documentation

## 2015-08-01 DIAGNOSIS — M549 Dorsalgia, unspecified: Secondary | ICD-10-CM | POA: Diagnosis present

## 2015-08-01 DIAGNOSIS — M542 Cervicalgia: Secondary | ICD-10-CM | POA: Diagnosis not present

## 2015-08-01 DIAGNOSIS — M5442 Lumbago with sciatica, left side: Secondary | ICD-10-CM | POA: Insufficient documentation

## 2015-08-01 DIAGNOSIS — I252 Old myocardial infarction: Secondary | ICD-10-CM | POA: Insufficient documentation

## 2015-08-01 DIAGNOSIS — J449 Chronic obstructive pulmonary disease, unspecified: Secondary | ICD-10-CM | POA: Insufficient documentation

## 2015-08-01 DIAGNOSIS — I10 Essential (primary) hypertension: Secondary | ICD-10-CM | POA: Insufficient documentation

## 2015-08-01 MED ORDER — TRIAMCINOLONE ACETONIDE 40 MG/ML IJ SUSP
40.0000 mg | Freq: Once | INTRAMUSCULAR | Status: AC
Start: 2015-08-01 — End: ?

## 2015-08-01 MED ORDER — ROPIVACAINE HCL 2 MG/ML IJ SOLN
INTRAMUSCULAR | Status: AC
Start: 2015-08-01 — End: 2015-08-01
  Administered 2015-08-01: 15:00:00
  Filled 2015-08-01: qty 10

## 2015-08-01 MED ORDER — SODIUM CHLORIDE 0.9% FLUSH: 10.0000 mL | Freq: Once | INTRAVENOUS | Status: AC

## 2015-08-01 MED ORDER — SODIUM CHLORIDE 0.9 % IJ SOLN
INTRAMUSCULAR | Status: AC
  Administered 2015-08-01: 15:00:00
  Filled 2015-08-01: qty 10

## 2015-08-01 MED ORDER — MIDAZOLAM HCL 5 MG/5ML IJ SOLN
INTRAMUSCULAR | Status: AC
  Filled 2015-08-01: qty 5

## 2015-08-01 MED ORDER — FENTANYL CITRATE (PF) 100 MCG/2ML IJ SOLN
INTRAMUSCULAR | Status: AC
  Filled 2015-08-01: qty 2

## 2015-08-01 MED ORDER — LIDOCAINE HCL (PF) 1 % IJ SOLN: 5.0000 mL | Freq: Once | INTRAMUSCULAR | Status: AC

## 2015-08-01 MED ORDER — TRIAMCINOLONE ACETONIDE 40 MG/ML IJ SUSP
INTRAMUSCULAR | Status: AC
  Administered 2015-08-01: 15:00:00
  Filled 2015-08-01: qty 1

## 2015-08-01 MED ORDER — ROPIVACAINE HCL 2 MG/ML IJ SOLN: 10.0000 mL | Freq: Once | INTRAMUSCULAR | Status: AC

## 2015-08-01 MED ORDER — IOPAMIDOL (ISOVUE-M 200) INJECTION 41%
INTRAMUSCULAR | Status: AC
  Administered 2015-08-01: 15:00:00
  Filled 2015-08-01: qty 10

## 2015-08-01 MED ORDER — LIDOCAINE HCL (PF) 1 % IJ SOLN
INTRAMUSCULAR | Status: AC
  Administered 2015-08-01: 15:00:00
  Filled 2015-08-01: qty 5

## 2015-08-01 NOTE — Patient Instructions (Addendum)
Pain Management Discharge Instructions  General Discharge Instructions :  If you need to reach your doctor call: Monday-Friday 8:00 am - 4:00 pm at [phone] or toll free [phone].  After clinic hours [phone] to have operator reach doctor.  Bring all of your medication bottles to all your appointments in the pain clinic.  To cancel or reschedule your appointment with Pain Management please remember to call 24 hours in advance to avoid a fee.  Refer to the educational materials which you have been given on: General Risks, I had my Procedure. Discharge Instructions, Post Sedation.  Post Procedure Instructions:  The drugs you were given will stay in your system until tomorrow, so for the next 24 hours you should not drive, make any legal decisions or drink any alcoholic beverages.  You may eat anything you prefer, but it is better to start with liquids then soups and crackers, and gradually work up to solid foods.  Please notify your doctor immediately if you have any unusual bleeding, trouble breathing or pain that is not related to your normal pain.  Depending on the type of procedure that was done, some parts of your body may feel week and/or numb.  This usually clears up by tonight or the next day.  Walk with the use of an assistive device or accompanied by an adult for the 24 hours.  You may use ice on the affected area for the first 24 hours.  Put ice in a Ziploc bag and cover with a towel and place against area 15 minutes on 15 minutes off.  You may switch to heat after 24 hours.Epidural Steroid Injection An epidural steroid injection is given to relieve pain in your neck, back, or legs that is caused by the irritation or swelling of a nerve root. This procedure involves injecting a steroid and numbing medicine (anesthetic) into the epidural space. The epidural space is the space between the outer covering of your spinal cord and the bones that form your backbone  (vertebra).  LET [facility] CARE PROVIDER KNOW ABOUT:  1. Any allergies you have. 2. All medicines you are taking, including vitamins, herbs, eye drops, creams, and over-the-counter medicines such as aspirin. 3. Previous problems you or members of your family have had with the use of anesthetics. 4. Any blood disorders or blood clotting disorders you have. 5. Previous surgeries you have had. 6. Medical conditions you have. RISKS AND COMPLICATIONS Generally, this is a safe procedure. However, as with any procedure, complications can occur. Possible complications of epidural steroid injection include: 1. Headache. 2. Bleeding. 3. Infection. 4. Allergic reaction to the medicines. 5. Damage to your nerves. The response to this procedure depends on the underlying cause of the pain and its duration. People who have long-term (chronic) pain are less likely to benefit from epidural steroids than are those people whose pain comes on strong and suddenly. BEFORE THE PROCEDURE  1. Ask your health care provider about changing or stopping your regular medicines. You may be advised to stop taking blood-thinning medicines a few days before the procedure. 2. You may be given medicines to reduce anxiety. 3. Arrange for someone to take you home after the procedure. PROCEDURE  1. You will remain awake during the procedure. You may receive medicine to make you relaxed. 2. You will be asked to lie on your stomach. 3. The injection site will be cleaned. 4. The injection site will be numbed with a medicine (local anesthetic). 5. A needle will be  injected through your skin into the epidural space. 6. Your health care provider will use an X-ray machine to ensure that the steroid is delivered closest to the affected nerve. You may have minimal discomfort at this time. 7. Once the needle is in the right position, the local anesthetic and the steroid will be injected into the epidural space. 8. The needle will then  be removed and a bandage will be applied to the injection site. AFTER THE PROCEDURE  1. You may be monitored for a short time before you go home. 2. You may feel weakness or numbness in your arm or leg, which disappears within hours. 3. You may be allowed to eat, drink, and take your regular medicine. 4. You may have soreness at the site of the injection.   This information is not intended to replace advice given to you by your health care provider. Make sure you discuss any questions you have with your health care provider.   Document Released: 06/25/2007 Document Revised: 11/18/2012 Document Reviewed: 09/04/2012 Elsevier Interactive Patient Education 2016 [employer]  What are the risk, side effects and possible complications? Generally speaking, most procedures are safe.  However, with any procedure there are risks, side effects, and the possibility of complications.  The risks and complications are dependent upon the sites that are lesioned, or the type of nerve block to be performed.  The closer the procedure is to the spine, the more serious the risks are.  Great care is taken when placing the radio frequency needles, block needles or lesioning probes, but sometimes complications can occur. 1. Infection: Any time there is an injection through the skin, there is a risk of infection.  This is why sterile conditions are used for these blocks.  There are four possible types of infection. 1. Localized skin infection. 2. Central Nervous System Infection-This can be in the form of Meningitis, which can be deadly. 3. Epidural Infections-This can be in the form of an epidural abscess, which can cause pressure inside of the spine, causing compression of the spinal cord with subsequent paralysis. This would require an emergency surgery to decompress, and there are no guarantees that the patient would recover from the paralysis. 4. Discitis-This is an infection of the  intervertebral discs.  It occurs in about 1% of discography procedures.  It is difficult to treat and it may lead to surgery.        2. Pain: the needles have to go through skin and soft tissues, will cause soreness.       3. Damage to internal structures:  The nerves to be lesioned may be near blood vessels or    other nerves which can be potentially damaged.       4. Bleeding: Bleeding is more common if the patient is taking blood thinners such as  aspirin, Coumadin, Ticiid, Plavix, etc., or if he/she have some genetic predisposition  such as hemophilia. Bleeding into the spinal canal can cause compression of the spinal  cord with subsequent paralysis.  This would require an emergency surgery to  decompress and there are no guarantees that the patient would recover from the  paralysis.       5. Pneumothorax:  Puncturing of a lung is a possibility, every time a needle is introduced in  the area of the chest or upper back.  Pneumothorax refers to free air around the  collapsed lung(s), inside of the thoracic cavity (chest cavity).  Another two  possible  complications related to a similar event would include: Hemothorax and Chylothorax.   These are variations of the Pneumothorax, where instead of air around the collapsed  lung(s), you may have blood or chyle, respectively.       6. Spinal headaches: They may occur with any procedures in the area of the spine.       7. Persistent CSF (Cerebro-Spinal Fluid) leakage: This is a rare problem, but may occur  with prolonged intrathecal or epidural catheters either due to the formation of a fistulous  track or a dural tear.       8. Nerve damage: By working so close to the spinal cord, there is always a possibility of  nerve damage, which could be as serious as a permanent spinal cord injury with  paralysis.       9. Death:  Although rare, severe deadly allergic reactions known as "Anaphylactic  reaction" can occur to any of the medications used.       10. Worsening of the symptoms:  We can always make thing worse.  What are the chances of something like this happening? Chances of any of this occuring are extremely low.  By statistics, you have more of a chance of getting killed in a motor vehicle accident: while driving to the hospital than any of the above occurring .  Nevertheless, you should be aware that they are possibilities.  In general, it is similar to taking a shower.  Everybody knows that you can slip, hit your head and get killed.  Does that mean that you should not shower again?  Nevertheless always keep in mind that statistics do not mean anything if you happen to be on the wrong side of them.  Even if a procedure has a 1 (one) in a 1,000,000 (million) chance of going wrong, it you happen to be that one..Also, keep in mind that by statistics, you have more of a chance of having something go wrong when taking medications.  Who should not have this procedure? If you are on a blood thinning medication (e.g. Coumadin, Plavix, see list of "Blood Thinners"), or if you have an active infection going on, you should not have the procedure.  If you are taking any blood thinners, please inform your physician.  How should I prepare for this procedure?  Do not eat or drink anything at least six hours prior to the procedure.  Bring a driver with you .  It cannot be a taxi.  Come accompanied by an adult that can drive you back, and that is strong enough to help you if your legs get weak or numb from the local anesthetic.  Take all of your medicines the morning of the procedure with just enough water to swallow them.  If you have diabetes, make sure that you are scheduled to have your procedure done first thing in the morning, whenever possible.  If you have diabetes, take only half of your insulin dose and notify our nurse that you have done so as soon as you arrive at the clinic.  If you are diabetic, but only take blood sugar pills (oral  hypoglycemic), then do not take them on the morning of your procedure.  You may take them after you have had the procedure.  Do not take aspirin or any aspirin-containing medications, at least eleven (11) days prior to the procedure.  They may prolong bleeding.  Wear loose fitting clothing that may be easy to take off  and that you would not mind if it got stained with Betadine or blood.  Do not wear any jewelry or perfume  Remove any nail coloring.  It will interfere with some of our monitoring equipment.  NOTE: Remember that this is not meant to be interpreted as a complete list of all possible complications.  Unforeseen problems may occur.  BLOOD THINNERS The following drugs contain aspirin or other products, which can cause increased bleeding during surgery and should not be taken for 2 weeks prior to and 1 week after surgery.  If you should need take something for relief of minor pain, you may take acetaminophen which is found in Tylenol,m Datril, Anacin-3 and Panadol. It is not blood thinner. The products listed below are.  Do not take any of the products listed below in addition to any listed on your instruction sheet.  A.P.C or A.P.C with Codeine Codeine Phosphate Capsules #3 Ibuprofen Ridaura  ABC compound Congesprin Imuran rimadil  Advil Cope Indocin Robaxisal  Alka-Seltzer Effervescent Pain Reliever and Antacid Coricidin or Coricidin-D  Indomethacin Rufen  Alka-Seltzer plus Cold Medicine Cosprin Ketoprofen S-A-C Tablets  Anacin Analgesic Tablets or Capsules Coumadin Korlgesic Salflex  Anacin Extra Strength Analgesic tablets or capsules CP-2 Tablets Lanoril Salicylate  Anaprox Cuprimine Capsules Levenox Salocol  Anexsia-D Dalteparin Magan Salsalate  Anodynos Darvon compound Magnesium Salicylate Sine-off  Ansaid Dasin Capsules Magsal Sodium Salicylate  Anturane Depen Capsules Marnal Soma  APF Arthritis pain formula Dewitt's Pills Measurin Stanback  Argesic Dia-Gesic Meclofenamic  Sulfinpyrazone  Arthritis Bayer Timed Release Aspirin Diclofenac Meclomen Sulindac  Arthritis pain formula Anacin Dicumarol Medipren Supac  Analgesic (Safety coated) Arthralgen Diffunasal Mefanamic Suprofen  Arthritis Strength Bufferin Dihydrocodeine Mepro Compound Suprol  Arthropan liquid Dopirydamole Methcarbomol with Aspirin Synalgos  ASA tablets/Enseals Disalcid Micrainin Tagament  Ascriptin Doan's Midol Talwin  Ascriptin A/D Dolene Mobidin Tanderil  Ascriptin Extra Strength Dolobid Moblgesic Ticlid  Ascriptin with Codeine Doloprin or Doloprin with Codeine Momentum Tolectin  Asperbuf Duoprin Mono-gesic Trendar  Aspergum Duradyne Motrin or Motrin IB Triminicin  Aspirin plain, buffered or enteric coated Durasal Myochrisine Trigesic  Aspirin Suppositories Easprin Nalfon Trillsate  Aspirin with Codeine Ecotrin Regular or Extra Strength Naprosyn Uracel  Atromid-S Efficin Naproxen Ursinus  Auranofin Capsules Elmiron Neocylate Vanquish  Axotal Emagrin Norgesic Verin  Azathioprine Empirin or Empirin with Codeine Normiflo Vitamin E  Azolid Emprazil Nuprin Voltaren  Bayer Aspirin plain, buffered or children's or timed BC Tablets or powders Encaprin Orgaran Warfarin Sodium  Buff-a-Comp Enoxaparin Orudis Zorpin  Buff-a-Comp with Codeine Equegesic Os-Cal-Gesic   Buffaprin Excedrin plain, buffered or Extra Strength Oxalid   Bufferin Arthritis Strength Feldene Oxphenbutazone   Bufferin plain or Extra Strength Feldene Capsules Oxycodone with Aspirin   Bufferin with Codeine Fenoprofen Fenoprofen Pabalate or Pabalate-SF   Buffets II Flogesic Panagesic   Buffinol plain or Extra Strength Florinal or Florinal with Codeine Panwarfarin   Buf-Tabs Flurbiprofen Penicillamine   Butalbital Compound Four-way cold tablets Penicillin   Butazolidin Fragmin Pepto-Bismol   Carbenicillin Geminisyn Percodan   Carna Arthritis Reliever Geopen Persantine   Carprofen Gold's salt Persistin   Chloramphenicol Goody's  Phenylbutazone   Chloromycetin Haltrain Piroxlcam   Clmetidine heparin Plaquenil   Cllnoril Hyco-pap Ponstel   Clofibrate Hydroxy chloroquine Propoxyphen         Before stopping any of these medications, be sure to consult the physician who ordered them.  Some, such as Coumadin (Warfarin) are ordered to prevent or treat serious conditions such as "deep thrombosis", "pumonary embolisms", and  other heart problems.  The amount of time that you may need off of the medication may also vary with the medication and the reason for which you were taking it.  If you are taking any of these medications, please make sure you notify your pain physician before you undergo any procedures.         Pain Management Discharge Instructions  General Discharge Instructions :  If you need to reach your doctor call: Monday-Friday 8:00 am - 4:00 pm at [phone] or toll free [phone].  After clinic hours [phone] to have operator reach doctor.  Bring all of your medication bottles to all your appointments in the pain clinic.  To cancel or reschedule your appointment with Pain Management please remember to call 24 hours in advance to avoid a fee.  Refer to the educational materials which you have been given on: General Risks, I had my Procedure. Discharge Instructions, Post Sedation.  Post Procedure Instructions:  The drugs you were given will stay in your system until tomorrow, so for the next 24 hours you should not drive, make any legal decisions or drink any alcoholic beverages.  You may eat anything you prefer, but it is better to start with liquids then soups and crackers, and gradually work up to solid foods.  Please notify your doctor immediately if you have any unusual bleeding, trouble breathing or pain that is not related to your normal pain.  Depending on the type of procedure that was done, some parts of your body may feel week and/or numb.  This usually clears up by tonight or the  next day.  Walk with the use of an assistive device or accompanied by an adult for the 24 hours.  You may use ice on the affected area for the first 24 hours.  Put ice in a Ziploc bag and cover with a towel and place against area 15 minutes on 15 minutes off.  You may switch to heat after 24 hours.GENERAL RISKS AND COMPLICATIONS  What are the risk, side effects and possible complications? Generally speaking, most procedures are safe.  However, with any procedure there are risks, side effects, and the possibility of complications.  The risks and complications are dependent upon the sites that are lesioned, or the type of nerve block to be performed.  The closer the procedure is to the spine, the more serious the risks are.  Great care is taken when placing the radio frequency needles, block needles or lesioning probes, but sometimes complications can occur. 2. Infection: Any time there is an injection through the skin, there is a risk of infection.  This is why sterile conditions are used for these blocks.  There are four possible types of infection. 1. Localized skin infection. 2. Central Nervous System Infection-This can be in the form of Meningitis, which can be deadly. 3. Epidural Infections-This can be in the form of an epidural abscess, which can cause pressure inside of the spine, causing compression of the spinal cord with subsequent paralysis. This would require an emergency surgery to decompress, and there are no guarantees that the patient would recover from the paralysis. 4. Discitis-This is an infection of the intervertebral discs.  It occurs in about 1% of discography procedures.  It is difficult to treat and it may lead to surgery.        2. Pain: the needles have to go through skin and soft tissues, will cause soreness.       3. Damage to  internal structures:  The nerves to be lesioned may be near blood vessels or    other nerves which can be potentially damaged.       4. Bleeding:  Bleeding is more common if the patient is taking blood thinners such as  aspirin, Coumadin, Ticiid, Plavix, etc., or if he/she have some genetic predisposition  such as hemophilia. Bleeding into the spinal canal can cause compression of the spinal  cord with subsequent paralysis.  This would require an emergency surgery to  decompress and there are no guarantees that the patient would recover from the  paralysis.       5. Pneumothorax:  Puncturing of a lung is a possibility, every time a needle is introduced in  the area of the chest or upper back.  Pneumothorax refers to free air around the  collapsed lung(s), inside of the thoracic cavity (chest cavity).  Another two possible  complications related to a similar event would include: Hemothorax and Chylothorax.   These are variations of the Pneumothorax, where instead of air around the collapsed  lung(s), you may have blood or chyle, respectively.       6. Spinal headaches: They may occur with any procedures in the area of the spine.       7. Persistent CSF (Cerebro-Spinal Fluid) leakage: This is a rare problem, but may occur  with prolonged intrathecal or epidural catheters either due to the formation of a fistulous  track or a dural tear.       8. Nerve damage: By working so close to the spinal cord, there is always a possibility of  nerve damage, which could be as serious as a permanent spinal cord injury with  paralysis.       9. Death:  Although rare, severe deadly allergic reactions known as "Anaphylactic  reaction" can occur to any of the medications used.      10. Worsening of the symptoms:  We can always make thing worse.  What are the chances of something like this happening? Chances of any of this occuring are extremely low.  By statistics, you have more of a chance of getting killed in a motor vehicle accident: while driving to the hospital than any of the above occurring .  Nevertheless, you should be aware that they are possibilities.  In  general, it is similar to taking a shower.  Everybody knows that you can slip, hit your head and get killed.  Does that mean that you should not shower again?  Nevertheless always keep in mind that statistics do not mean anything if you happen to be on the wrong side of them.  Even if a procedure has a 1 (one) in a 1,000,000 (million) chance of going wrong, it you happen to be that one..Also, keep in mind that by statistics, you have more of a chance of having something go wrong when taking medications.  Who should not have this procedure? If you are on a blood thinning medication (e.g. Coumadin, Plavix, see list of "Blood Thinners"), or if you have an active infection going on, you should not have the procedure.  If you are taking any blood thinners, please inform your physician.  How should I prepare for this procedure?  Do not eat or drink anything at least six hours prior to the procedure.  Bring a driver with you .  It cannot be a taxi.  Come accompanied by an adult that can drive you back, and that is strong  enough to help you if your legs get weak or numb from the local anesthetic.  Take all of your medicines the morning of the procedure with just enough water to swallow them.  If you have diabetes, make sure that you are scheduled to have your procedure done first thing in the morning, whenever possible.  If you have diabetes, take only half of your insulin dose and notify our nurse that you have done so as soon as you arrive at the clinic.  If you are diabetic, but only take blood sugar pills (oral hypoglycemic), then do not take them on the morning of your procedure.  You may take them after you have had the procedure.  Do not take aspirin or any aspirin-containing medications, at least eleven (11) days prior to the procedure.  They may prolong bleeding.  Wear loose fitting clothing that may be easy to take off and that you would not mind if it got stained with Betadine or  blood.  Do not wear any jewelry or perfume  Remove any nail coloring.  It will interfere with some of our monitoring equipment.  NOTE: Remember that this is not meant to be interpreted as a complete list of all possible complications.  Unforeseen problems may occur.  BLOOD THINNERS The following drugs contain aspirin or other products, which can cause increased bleeding during surgery and should not be taken for 2 weeks prior to and 1 week after surgery.  If you should need take something for relief of minor pain, you may take acetaminophen which is found in Tylenol,m Datril, Anacin-3 and Panadol. It is not blood thinner. The products listed below are.  Do not take any of the products listed below in addition to any listed on your instruction sheet.  A.P.C or A.P.C with Codeine Codeine Phosphate Capsules #3 Ibuprofen Ridaura  ABC compound Congesprin Imuran rimadil  Advil Cope Indocin Robaxisal  Alka-Seltzer Effervescent Pain Reliever and Antacid Coricidin or Coricidin-D  Indomethacin Rufen  Alka-Seltzer plus Cold Medicine Cosprin Ketoprofen S-A-C Tablets  Anacin Analgesic Tablets or Capsules Coumadin Korlgesic Salflex  Anacin Extra Strength Analgesic tablets or capsules CP-2 Tablets Lanoril Salicylate  Anaprox Cuprimine Capsules Levenox Salocol  Anexsia-D Dalteparin Magan Salsalate  Anodynos Darvon compound Magnesium Salicylate Sine-off  Ansaid Dasin Capsules Magsal Sodium Salicylate  Anturane Depen Capsules Marnal Soma  APF Arthritis pain formula Dewitt's Pills Measurin Stanback  Argesic Dia-Gesic Meclofenamic Sulfinpyrazone  Arthritis Bayer Timed Release Aspirin Diclofenac Meclomen Sulindac  Arthritis pain formula Anacin Dicumarol Medipren Supac  Analgesic (Safety coated) Arthralgen Diffunasal Mefanamic Suprofen  Arthritis Strength Bufferin Dihydrocodeine Mepro Compound Suprol  Arthropan liquid Dopirydamole Methcarbomol with Aspirin Synalgos  ASA tablets/Enseals Disalcid Micrainin  Tagament  Ascriptin Doan's Midol Talwin  Ascriptin A/D Dolene Mobidin Tanderil  Ascriptin Extra Strength Dolobid Moblgesic Ticlid  Ascriptin with Codeine Doloprin or Doloprin with Codeine Momentum Tolectin  Asperbuf Duoprin Mono-gesic Trendar  Aspergum Duradyne Motrin or Motrin IB Triminicin  Aspirin plain, buffered or enteric coated Durasal Myochrisine Trigesic  Aspirin Suppositories Easprin Nalfon Trillsate  Aspirin with Codeine Ecotrin Regular or Extra Strength Naprosyn Uracel  Atromid-S Efficin Naproxen Ursinus  Auranofin Capsules Elmiron Neocylate Vanquish  Axotal Emagrin Norgesic Verin  Azathioprine Empirin or Empirin with Codeine Normiflo Vitamin E  Azolid Emprazil Nuprin Voltaren  Bayer Aspirin plain, buffered or children's or timed BC Tablets or powders Encaprin Orgaran Warfarin Sodium  Buff-a-Comp Enoxaparin Orudis Zorpin  Buff-a-Comp with Codeine Equegesic [place]-Cal-Gesic   Buffaprin Excedrin plain, buffered or Extra Strength  Oxalid   Bufferin Arthritis Strength Feldene Oxphenbutazone   Bufferin plain or Extra Strength Feldene Capsules Oxycodone with Aspirin   Bufferin with Codeine Fenoprofen Fenoprofen Pabalate or Pabalate-SF   Buffets II Flogesic Panagesic   Buffinol plain or Extra Strength Florinal or Florinal with Codeine Panwarfarin   Buf-Tabs Flurbiprofen Penicillamine   Butalbital Compound Four-way cold tablets Penicillin   Butazolidin Fragmin Pepto-Bismol   Carbenicillin Geminisyn Percodan   Carna Arthritis Reliever Geopen Persantine   Carprofen Gold's salt Persistin   Chloramphenicol Goody's Phenylbutazone   Chloromycetin Haltrain Piroxlcam   Clmetidine heparin Plaquenil   Cllnoril Hyco-pap Ponstel   Clofibrate Hydroxy chloroquine Propoxyphen         Before stopping any of these medications, be sure to consult the physician who ordered them.  Some, such as Coumadin (Warfarin) are ordered to prevent or treat serious conditions such as "deep thrombosis", "pumonary  embolisms", and other heart problems.  The amount of time that you may need off of the medication may also vary with the medication and the reason for which you were taking it.  If you are taking any of these medications, please make sure you notify your pain physician before you undergo any procedures.         Epidural Steroid Injection Patient Information  Description: The epidural space surrounds the nerves as they exit the spinal cord.  In some patients, the nerves can be compressed and inflamed by a bulging disc or a tight spinal canal (spinal stenosis).  By injecting steroids into the epidural space, we can bring irritated nerves into direct contact with a potentially helpful medication.  These steroids act directly on the irritated nerves and can reduce swelling and inflammation which often leads to decreased pain.  Epidural steroids may be injected anywhere along the spine and from the neck to the low back depending upon the location of your pain.   After numbing the skin with local anesthetic (like Novocaine), a small needle is passed into the epidural space slowly.  You may experience a sensation of pressure while this is being done.  The entire block usually last less than 10 minutes.  Conditions which may be treated by epidural steroids:   Low back and leg pain  Neck and arm pain  Spinal stenosis  Post-laminectomy syndrome  Herpes zoster (shingles) pain  Pain from compression fractures  Preparation for the injection:  6. Do not eat any solid food or dairy products within 8 hours of your appointment.  7. You may drink clear liquids up to 3 hours before appointment.  Clear liquids include water, black coffee, juice or soda.  No milk or cream please. 8. You may take your regular medication, including pain medications, with a sip of water before your appointment  Diabetics should hold regular insulin (if taken separately) and take 1/2 normal NPH dos the morning of the  procedure.  Carry some sugar containing items with you to your appointment. 9. A driver must accompany you and be prepared to drive you home after your procedure.  10. Bring all your current medications with your. 11. An IV may be inserted and sedation may be given at the discretion of the physician.   12. A blood pressure cuff, EKG and other monitors will often be applied during the procedure.  Some patients may need to have extra oxygen administered for a short period. 35. You will be asked to provide medical information, including your allergies, prior to the procedure.  We must  know immediately if you are taking blood thinners (like Coumadin/Warfarin)  Or if you are allergic to IV iodine contrast (dye). We must know if you could possible be pregnant.  Possible side-effects:  Bleeding from needle site  Infection (rare, may require surgery)  Nerve injury (rare)  Numbness & tingling (temporary)  Difficulty urinating (rare, temporary)  Spinal headache ( a headache worse with upright posture)  Light -headedness (temporary)  Pain at injection site (several days)  Decreased blood pressure (temporary)  Weakness in arm/leg (temporary)  Pressure sensation in back/neck (temporary)  Call if you experience:  Fever/chills associated with headache or increased back/neck pain.  Headache worsened by an upright position.  New onset weakness or numbness of an extremity below the injection site  Hives or difficulty breathing (go to the emergency room)  Inflammation or drainage at the infection site  Severe back/neck pain  Any new symptoms which are concerning to you  Please note:  Although the local anesthetic injected can often make your back or neck feel good for several hours after the injection, the pain will likely return.  It takes 3-7 days for steroids to work in the epidural space.  You may not notice any pain relief for at least that one week.  If effective, we will often  do a series of three injections spaced 3-6 weeks apart to maximally decrease your pain.  After the initial series, we generally will wait several months before considering a repeat injection of the same type.  If you have any questions, please call [phone] [facility] Epidural Steroid Injection Patient Information  Description: The epidural space surrounds the nerves as they exit the spinal cord.  In some patients, the nerves can be compressed and inflamed by a bulging disc or a tight spinal canal (spinal stenosis).  By injecting steroids into the epidural space, we can bring irritated nerves into direct contact with a potentially helpful medication.  These steroids act directly on the irritated nerves and can reduce swelling and inflammation which often leads to decreased pain.  Epidural steroids may be injected anywhere along the spine and from the neck to the low back depending upon the location of your pain.   After numbing the skin with local anesthetic (like Novocaine), a small needle is passed into the epidural space slowly.  You may experience a sensation of pressure while this is being done.  The entire block usually last less than 10 minutes.  Conditions which may be treated by epidural steroids:   Low back and leg pain  Neck and arm pain  Spinal stenosis  Post-laminectomy syndrome  Herpes zoster (shingles) pain  Pain from compression fractures  Preparation for the injection:  14. Do not eat any solid food or dairy products within 8 hours of your appointment.  15. You may drink clear liquids up to 3 hours before appointment.  Clear liquids include water, black coffee, juice or soda.  No milk or cream please. 76. You may take your regular medication, including pain medications, with a sip of water before your appointment  Diabetics should hold regular insulin (if taken separately) and take 1/2 normal NPH dos the morning of the procedure.   Carry some sugar containing items with you to your appointment. 17. A driver must accompany you and be prepared to drive you home after your procedure.  33. Bring all your current medications with your. 19. An IV may be inserted and sedation may be given  at the discretion of the physician.   20. A blood pressure cuff, EKG and other monitors will often be applied during the procedure.  Some patients may need to have extra oxygen administered for a short period. 21. You will be asked to provide medical information, including your allergies, prior to the procedure.  We must know immediately if you are taking blood thinners (like Coumadin/Warfarin)  Or if you are allergic to IV iodine contrast (dye). We must know if you could possible be pregnant.  Possible side-effects:  Bleeding from needle site  Infection (rare, may require surgery)  Nerve injury (rare)  Numbness & tingling (temporary)  Difficulty urinating (rare, temporary)  Spinal headache ( a headache worse with upright posture)  Light -headedness (temporary)  Pain at injection site (several days)  Decreased blood pressure (temporary)  Weakness in arm/leg (temporary)  Pressure sensation in back/neck (temporary)  Call if you experience:  Fever/chills associated with headache or increased back/neck pain.  Headache worsened by an upright position.  New onset weakness or numbness of an extremity below the injection site  Hives or difficulty breathing (go to the emergency room)  Inflammation or drainage at the infection site  Severe back/neck pain  Any new symptoms which are concerning to you  Please note:  Although the local anesthetic injected can often make your back or neck feel good for several hours after the injection, the pain will likely return.  It takes 3-7 days for steroids to work in the epidural space.  You may not notice any pain relief for at least that one week.  If effective, we will often do a series  of three injections spaced 3-6 weeks apart to maximally decrease your pain.  After the initial series, we generally will wait several months before considering a repeat injection of the same type.  If you have any questions, please call [phone] [facility]

## 2015-08-01 NOTE — Progress Notes (Signed)
Safety precautions to be maintained throughout the outpatient stay will include: orient to surroundings, keep bed in low position, maintain call bell within reach at all times, provide assistance with transfer out of bed and ambulation.  

## 2015-08-01 NOTE — Progress Notes (Signed)
Subjective:  Patient ID: Deborah Poole, female    DOB: Jul 10, 1935  Age: 80 y.o. MRN: [id number]  CC: Back Pain and Neck Pain  Procedure: L5-S1 epidural #1 under fluoroscopic guidance and moderate sedation  HPI Deborah Poole presents for evaluation as a new patient. She is referred by Dr. Thornton Park for evaluation of her low back pain. She has had a 4 month history of low back pain with radiation into the right anterior thigh region. It has been severe with a maximum VAS score of 8 and minimum of 3 averaging 4-5. It seems to be worse at night or when she is lying down with associated aggravating factors include sitting standing squatting stooping twisting and walking but alleviated by rest cold application and medication management. She has been on oxycodone acetaminophen 5/325 daily when necessary for pain relief. She denies weakness to the lower extremities or problems with bowel or bladder dysfunction. She has had some chronic numbness and tingling over the right anterior thigh region with associated cramping and complains of aching that is constant. She's had previous orthopedic and chiropractic evaluation with x-rays that show degenerative joint disease of the low back but no previous MRI is noted.  History Deborah Poole has a past medical history of Thyroid disease; CHF (congestive heart failure) ([place]); Cataract; Breast cancer ([place]); Heart attack ([place]); COPD (chronic obstructive pulmonary disease) ([place]); Irritable bowel syndrome (IBS); and Hypertension.   She has past surgical history that includes Cholecystectomy; Breast surgery; Cardiac surgery; Eye surgery; STENT PLACEMENT VASCULAR ([facility]); Rotator cuff repair; and Abdominal hysterectomy.   Her family history includes Hypertension in her other.She reports that she has quit smoking. She does not have any smokeless tobacco history on file. She reports that she drinks alcohol. She reports that she does not use illicit  drugs.   ---------------------------------------------------------------------------------------------------------------------- Past Medical History  Diagnosis Date  . Thyroid disease   . CHF (congestive heart failure) ([place])   . Cataract   . Breast cancer ([place])   . Heart attack ([place])   . COPD (chronic obstructive pulmonary disease) ([place])   . Irritable bowel syndrome (IBS)   . Hypertension     Past Surgical History  Procedure Laterality Date  . Cholecystectomy    . Breast surgery    . Cardiac surgery    . Eye surgery    . Stent placement vascular ([facility] hx)    . Rotator cuff repair    . Abdominal hysterectomy      Family History  Problem Relation Age of Onset  . Hypertension Other     Social History  Substance Use Topics  . Smoking status: Former Research scientist (life sciences)  . Smokeless tobacco: Not on file  . Alcohol Use: 0.0 oz/week    0 Standard drinks or equivalent per week     Comment: once a month or so     ---------------------------------------------------------------------------------------------------------------------- Social History   Social History  . Marital Status: Married    Spouse Name: N/A  . Number of Children: N/A  . Years of Education: N/A   Social History Main Topics  . Smoking status: Former Research scientist (life sciences)  . Smokeless tobacco: None  . Alcohol Use: 0.0 oz/week    0 Standard drinks or equivalent per week     Comment: once a month or so   . Drug Use: No  . Sexual Activity: Not Asked   Other Topics Concern  . None   Social History Narrative      ----------------------------------------------------------------------------------------------------------------------  ROS Review of  Systems  Cardiac: History of high blood pressure R[place] MI heart surgery with stance and congestive heart failure Pulmonary: COPD history of shortness of breath bronchitis and lung problems Neurologic: Negative Psychologic: Anxiety depression panic attacks Gastrointestinal: Reflux  and IBS GU: Diminished kidney function   Objective:  BP 144/71 mmHg  Pulse 83  Temp(Src) 98.8 F (37.1 C) (Oral)  Resp 20  Ht 5' (1.524 m)  Wt 204 lb (92.534 kg)  BMI 39.84 kg/m2  SpO2 93%  Physical Exam  Patient is alert and oriented cooperative and compliant Pupils are equally round reactive light EOMI Heart is regular rate and rhythm without murmur Lungs are clear to also dictation without wheezing Inspection low back reveals some mild paraspinous muscle tenderness but no overt trigger points. She does have some pain with extension with bilateral rotation muscle strength appears to be well preserved throughout the lower extremities both proximal and distal     Assessment & Plan:   Deborah Poole was seen today for back pain and neck pain.  Diagnoses and all orders for this visit:  DDD (degenerative disc disease), lumbar -     LUMBAR EPIDURAL STEROID INJECTION -     LUMBAR EPIDURAL STEROID INJECTION; Future -     triamcinolone acetonide (KENALOG-40) injection 40 mg; 1 mL (40 mg total) by Other route once. -     sodium chloride flush (NS) 0.9 % injection 10 mL; 10 mLs by Other route once. -     ropivacaine (PF) 2 mg/ml (0.2%) (NAROPIN) epidural 10 mL; 10 mLs by Epidural route once. -     lidocaine (PF) (XYLOCAINE) 1 % injection 5 mL; Inject 5 mLs into the skin once.  Sciatica associated with disorder of lumbar spine, left -     LUMBAR EPIDURAL STEROID INJECTION -     LUMBAR EPIDURAL STEROID INJECTION; Future -     triamcinolone acetonide (KENALOG-40) injection 40 mg; 1 mL (40 mg total) by Other route once. -     sodium chloride flush (NS) 0.9 % injection 10 mL; 10 mLs by Other route once. -     ropivacaine (PF) 2 mg/ml (0.2%) (NAROPIN) epidural 10 mL; 10 mLs by Epidural route once. -     lidocaine (PF) (XYLOCAINE) 1 % injection 5 mL; Inject 5 mLs into the skin once.  Other orders -     iopamidol (ISOVUE-M) 41 % intrathecal injection;  -     lidocaine (PF) (XYLOCAINE) 1 %  injection;  -     sodium chloride 0.9 % injection;  -     ropivacaine (PF) 2 mg/ml (0.2%) (NAROPIN) 2 MG/ML epidural;  -     fentaNYL (SUBLIMAZE) 100 MCG/2ML injection;  -     midazolam (VERSED) 5 MG/5ML injection;  -     triamcinolone acetonide (KENALOG-40) 40 MG/ML injection;      ----------------------------------------------------------------------------------------------------------------------  Problem List Items Addressed This Visit    None    Visit Diagnoses    DDD (degenerative disc disease), lumbar    -  Primary    Relevant Medications    HYDROcodone-acetaminophen (NORCO/VICODIN) 5-325 MG tablet    oxyCODONE-acetaminophen (PERCOCET/ROXICET) 5-325 MG tablet    ibuprofen (ADVIL,MOTRIN) 200 MG tablet    fentaNYL (SUBLIMAZE) 100 MCG/2ML injection    triamcinolone acetonide (KENALOG-40) 40 MG/ML injection (Completed)    triamcinolone acetonide (KENALOG-40) injection 40 mg    sodium chloride flush (NS) 0.9 % injection 10 mL    ropivacaine (PF) 2 mg/ml (0.2%) (NAROPIN) epidural 10 mL  lidocaine (PF) (XYLOCAINE) 1 % injection 5 mL    Other Relevant Orders    LUMBAR EPIDURAL STEROID INJECTION    LUMBAR EPIDURAL STEROID INJECTION    Sciatica associated with disorder of lumbar spine, left        Relevant Medications    midazolam (VERSED) 5 MG/5ML injection    triamcinolone acetonide (KENALOG-40) injection 40 mg    sodium chloride flush (NS) 0.9 % injection 10 mL    ropivacaine (PF) 2 mg/ml (0.2%) (NAROPIN) epidural 10 mL    lidocaine (PF) (XYLOCAINE) 1 % injection 5 mL    Other Relevant Orders    LUMBAR EPIDURAL STEROID INJECTION    LUMBAR EPIDURAL STEROID INJECTION       ----------------------------------------------------------------------------------------------------------------------  1. DDD (degenerative disc disease), lumbar We'll plan on her first epidural steroid injection today. The risks and benefits been reviewed with her in full detail and all questions  answered. We'll attempt to do this at the L4-5 interspace and we will have her return to clinic in 1 month for reevaluation possible repeat injection - LUMBAR EPIDURAL STEROID INJECTION - LUMBAR EPIDURAL STEROID INJECTION; Future - triamcinolone acetonide (KENALOG-40) injection 40 mg; 1 mL (40 mg total) by Other route once. - sodium chloride flush (NS) 0.9 % injection 10 mL; 10 mLs by Other route once. - ropivacaine (PF) 2 mg/ml (0.2%) (NAROPIN) epidural 10 mL; 10 mLs by Epidural route once. - lidocaine (PF) (XYLOCAINE) 1 % injection 5 mL; Inject 5 mLs into the skin once.  2. Sciatica associated with disorder of lumbar spine, left Same as 1. She also may be a candidate for a bilateral facet block. Is more we have discussed options regarding physical therapy with efforts at weight loss and or strengthening - LUMBAR EPIDURAL STEROID INJECTION - LUMBAR EPIDURAL STEROID INJECTION; Future - triamcinolone acetonide (KENALOG-40) injection 40 mg; 1 mL (40 mg total) by Other route once. - sodium chloride flush (NS) 0.9 % injection 10 mL; 10 mLs by Other route once. - ropivacaine (PF) 2 mg/ml (0.2%) (NAROPIN) epidural 10 mL; 10 mLs by Epidural route once. - lidocaine (PF) (XYLOCAINE) 1 % injection 5 mL; Inject 5 mLs into the skin once.    ----------------------------------------------------------------------------------------------------------------------  I am having Deborah Poole maintain her carvedilol, omeprazole, simvastatin, spironolactone, ALPRAZolam, cholecalciferol, Magnesium, NIFEdipine, oyster calcium, albuterol, oxycodone-acetaminophen, VOLTAREN, HYDROcodone-acetaminophen, oxyCODONE-acetaminophen, fluticasone, Biotin, Melatonin, and ibuprofen. We administered iopamidol, lidocaine (PF), sodium chloride, ropivacaine (PF) 2 mg/ml (0.2%), and triamcinolone acetonide. We will continue to administer triamcinolone acetonide, sodium chloride flush, ropivacaine (PF) 2 mg/ml (0.2%), and lidocaine  (PF).   Meds ordered this encounter  Medications  . VOLTAREN 1 % GEL    Sig: 4 (four) times daily.   Marland Kitchen HYDROcodone-acetaminophen (NORCO/VICODIN) 5-325 MG tablet    Sig: 1 tablet 3 (three) times daily.   Marland Kitchen oxyCODONE-acetaminophen (PERCOCET/ROXICET) 5-325 MG tablet    Sig: 1 tablet every 8 (eight) hours as needed. Reported on 08/01/2015  . fluticasone (FLONASE) 50 MCG/ACT nasal spray    Sig: Place into both nostrils daily.  . Biotin 1000 MCG tablet    Sig: Take 1,000 mcg by mouth once a week.  . Melatonin 5 MG TABS    Sig: Take by mouth at bedtime.  Marland Kitchen ibuprofen (ADVIL,MOTRIN) 200 MG tablet    Sig: Take 200 mg by mouth as needed.  . iopamidol (ISOVUE-M) 41 % intrathecal injection    Sig:     Donneta Romberg, Dena: cabinet override  . lidocaine (PF) (XYLOCAINE) 1 % injection  SigDonneta Romberg, Dena: cabinet override  . sodium chloride 0.9 % injection    Sig:     Donneta Romberg, Dena: cabinet override  . ropivacaine (PF) 2 mg/ml (0.2%) (NAROPIN) 2 MG/ML epidural    Sig:     Donneta Romberg, Dena: cabinet override  . fentaNYL (SUBLIMAZE) 100 MCG/2ML injection    Sig:     Donneta Romberg, Dena: cabinet override  . midazolam (VERSED) 5 MG/5ML injection    Sig:     Donneta Romberg, Dena: cabinet override  . triamcinolone acetonide (KENALOG-40) 40 MG/ML injection    Sig:     Donneta Romberg, Dena: cabinet override  . triamcinolone acetonide (KENALOG-40) injection 40 mg    Sig:   . sodium chloride flush (NS) 0.9 % injection 10 mL    Sig:   . ropivacaine (PF) 2 mg/ml (0.2%) (NAROPIN) epidural 10 mL    Sig:   . lidocaine (PF) (XYLOCAINE) 1 % injection 5 mL    Sig:     Procedure:  Procedure: L4-5 LESI #1 of series with fluoroscopic guidance and moderate sedation  NOTE: The risks, benefits, and expectations of the procedure have been discussed and explained to the patient who was understanding and in agreement with suggested treatment plan. No guarantees were made.  DESCRIPTION OF PROCEDURE: Lumbar epidural steroid  injection with IV Versed, EKG, blood pressure, pulse, and pulse oximetry monitoring. The procedure was performed with the patient in the prone position under fluoroscopic guidance. A local anesthetic skin wheal of 1.5% plain lidocaine was performed at the appropriate site after fluoroscopic identifictation  Using strict aseptic technique, I then advanced an 18-gauge Tuohy epidural needle in the midline via loss-of-resistance to saline. There was negative aspiration for heme or  CSF.  I then confirmed position with both AP and Lateral fluoroscan. 2 cc of Isovue was injected getting good epidural spread and at L4-5  A total of 5 mL of Preservative-Free normal saline with 40 mg of Kenalog and 1cc Ropicaine 0.2 percent was injected incrementally via the  epidurally placed needle. Needle removed. The patient tolerated the injection well and was convalesced and discharged to home in stable condition. If the patient has any post procedure difficulty they have been instructed on how to contact us for assistance. He chose to do this without IV sedation.    Molli Barrows, MD

## 2015-08-02 ENCOUNTER — Telehealth: Payer: Self-pay | Admitting: *Deleted

## 2015-08-02 DIAGNOSIS — I2510 Atherosclerotic heart disease of native coronary artery without angina pectoris: Secondary | ICD-10-CM | POA: Diagnosis not present

## 2015-08-02 DIAGNOSIS — J449 Chronic obstructive pulmonary disease, unspecified: Secondary | ICD-10-CM | POA: Diagnosis not present

## 2015-08-02 NOTE — Telephone Encounter (Signed)
Spoke with patient re; procedure on yesterday, verbalizes no questions or concerns.  

## 2015-08-14 DIAGNOSIS — M5412 Radiculopathy, cervical region: Secondary | ICD-10-CM | POA: Diagnosis not present

## 2015-08-15 ENCOUNTER — Other Ambulatory Visit: Payer: Self-pay | Admitting: Orthopedic Surgery

## 2015-08-15 DIAGNOSIS — M5412 Radiculopathy, cervical region: Secondary | ICD-10-CM

## 2015-08-21 ENCOUNTER — Ambulatory Visit: Payer: Medicare Other | Admitting: Anesthesiology

## 2015-09-01 ENCOUNTER — Encounter: Payer: Self-pay | Admitting: Anesthesiology

## 2015-09-01 ENCOUNTER — Ambulatory Visit: Payer: Medicare Other | Attending: Anesthesiology | Admitting: Anesthesiology

## 2015-09-01 VITALS — BP 143/84 | HR 85 | Temp 98.6°F | Resp 20 | Ht 60.0 in | Wt 193.0 lb

## 2015-09-01 DIAGNOSIS — I10 Essential (primary) hypertension: Secondary | ICD-10-CM | POA: Diagnosis not present

## 2015-09-01 DIAGNOSIS — M25559 Pain in unspecified hip: Secondary | ICD-10-CM | POA: Diagnosis present

## 2015-09-01 DIAGNOSIS — M549 Dorsalgia, unspecified: Secondary | ICD-10-CM | POA: Diagnosis present

## 2015-09-01 DIAGNOSIS — K589 Irritable bowel syndrome without diarrhea: Secondary | ICD-10-CM | POA: Insufficient documentation

## 2015-09-01 DIAGNOSIS — I252 Old myocardial infarction: Secondary | ICD-10-CM | POA: Diagnosis not present

## 2015-09-01 DIAGNOSIS — M5432 Sciatica, left side: Secondary | ICD-10-CM | POA: Diagnosis not present

## 2015-09-01 DIAGNOSIS — M5136 Other intervertebral disc degeneration, lumbar region: Secondary | ICD-10-CM | POA: Diagnosis not present

## 2015-09-01 DIAGNOSIS — H269 Unspecified cataract: Secondary | ICD-10-CM | POA: Insufficient documentation

## 2015-09-01 DIAGNOSIS — I509 Heart failure, unspecified: Secondary | ICD-10-CM | POA: Insufficient documentation

## 2015-09-01 DIAGNOSIS — E079 Disorder of thyroid, unspecified: Secondary | ICD-10-CM | POA: Insufficient documentation

## 2015-09-01 DIAGNOSIS — Z9049 Acquired absence of other specified parts of digestive tract: Secondary | ICD-10-CM | POA: Insufficient documentation

## 2015-09-01 DIAGNOSIS — Z853 Personal history of malignant neoplasm of breast: Secondary | ICD-10-CM | POA: Insufficient documentation

## 2015-09-01 DIAGNOSIS — Z87891 Personal history of nicotine dependence: Secondary | ICD-10-CM | POA: Diagnosis not present

## 2015-09-01 DIAGNOSIS — Z90710 Acquired absence of both cervix and uterus: Secondary | ICD-10-CM | POA: Diagnosis not present

## 2015-09-01 DIAGNOSIS — J449 Chronic obstructive pulmonary disease, unspecified: Secondary | ICD-10-CM | POA: Diagnosis not present

## 2015-09-01 DIAGNOSIS — M542 Cervicalgia: Secondary | ICD-10-CM | POA: Diagnosis present

## 2015-09-01 DIAGNOSIS — M5442 Lumbago with sciatica, left side: Secondary | ICD-10-CM | POA: Insufficient documentation

## 2015-09-01 MED ORDER — ROPIVACAINE HCL 2 MG/ML IJ SOLN
INTRAMUSCULAR | Status: AC
  Administered 2015-09-01: 13:00:00
  Filled 2015-09-01: qty 10

## 2015-09-01 MED ORDER — IOPAMIDOL (ISOVUE-M 200) INJECTION 41%
INTRAMUSCULAR | Status: AC
  Administered 2015-09-01: 13:00:00
  Filled 2015-09-01: qty 10

## 2015-09-01 MED ORDER — LIDOCAINE HCL (PF) 1 % IJ SOLN
INTRAMUSCULAR | Status: AC
  Administered 2015-09-01: 13:00:00
  Filled 2015-09-01: qty 5

## 2015-09-01 MED ORDER — TRIAMCINOLONE ACETONIDE 40 MG/ML IJ SUSP
INTRAMUSCULAR | Status: AC
Start: 2015-09-01 — End: 2015-09-01
  Administered 2015-09-01: 13:00:00
  Filled 2015-09-01: qty 1

## 2015-09-01 NOTE — Patient Instructions (Signed)
Pain Management Discharge Instructions  General Discharge Instructions :  If you need to reach your doctor call: Monday-Friday 8:00 am - 4:00 pm at [phone] or toll free [phone].  After clinic hours [phone] to have operator reach doctor.  Bring all of your medication bottles to all your appointments in the pain clinic.  To cancel or reschedule your appointment with Pain Management please remember to call 24 hours in advance to avoid a fee.  Refer to the educational materials which you have been given on: General Risks, I had my Procedure. Discharge Instructions, Post Sedation.  Post Procedure Instructions:  The drugs you were given will stay in your system until tomorrow, so for the next 24 hours you should not drive, make any legal decisions or drink any alcoholic beverages.  You may eat anything you prefer, but it is better to start with liquids then soups and crackers, and gradually work up to solid foods.  Please notify your doctor immediately if you have any unusual bleeding, trouble breathing or pain that is not related to your normal pain.  Depending on the type of procedure that was done, some parts of your body may feel week and/or numb.  This usually clears up by tonight or the next day.  Walk with the use of an assistive device or accompanied by an adult for the 24 hours.  You may use ice on the affected area for the first 24 hours.  Put ice in a Ziploc bag and cover with a towel and place against area 15 minutes on 15 minutes off.  You may switch to heat after 24 hours.GENERAL RISKS AND COMPLICATIONS  What are the risk, side effects and possible complications? Generally speaking, most procedures are safe.  However, with any procedure there are risks, side effects, and the possibility of complications.  The risks and complications are dependent upon the sites that are lesioned, or the type of nerve block to be performed.  The closer the procedure is to the spine,  the more serious the risks are.  Great care is taken when placing the radio frequency needles, block needles or lesioning probes, but sometimes complications can occur. 1. Infection: Any time there is an injection through the skin, there is a risk of infection.  This is why sterile conditions are used for these blocks.  There are four possible types of infection. 1. Localized skin infection. 2. Central Nervous System Infection-This can be in the form of Meningitis, which can be deadly. 3. Epidural Infections-This can be in the form of an epidural abscess, which can cause pressure inside of the spine, causing compression of the spinal cord with subsequent paralysis. This would require an emergency surgery to decompress, and there are no guarantees that the patient would recover from the paralysis. 4. Discitis-This is an infection of the intervertebral discs.  It occurs in about 1% of discography procedures.  It is difficult to treat and it may lead to surgery.        2. Pain: the needles have to go through skin and soft tissues, will cause soreness.       3. Damage to internal structures:  The nerves to be lesioned may be near blood vessels or    other nerves which can be potentially damaged.       4. Bleeding: Bleeding is more common if the patient is taking blood thinners such as  aspirin, Coumadin, Ticiid, Plavix, etc., or if he/she have some genetic predisposition  such as   hemophilia. Bleeding into the spinal canal can cause compression of the spinal  cord with subsequent paralysis.  This would require an emergency surgery to  decompress and there are no guarantees that the patient would recover from the  paralysis.       5. Pneumothorax:  Puncturing of a lung is a possibility, every time a needle is introduced in  the area of the chest or upper back.  Pneumothorax refers to free air around the  collapsed lung(s), inside of the thoracic cavity (chest cavity).  Another two possible  complications  related to a similar event would include: Hemothorax and Chylothorax.   These are variations of the Pneumothorax, where instead of air around the collapsed  lung(s), you may have blood or chyle, respectively.       6. Spinal headaches: They may occur with any procedures in the area of the spine.       7. Persistent CSF (Cerebro-Spinal Fluid) leakage: This is a rare problem, but may occur  with prolonged intrathecal or epidural catheters either due to the formation of a fistulous  track or a dural tear.       8. Nerve damage: By working so close to the spinal cord, there is always a possibility of  nerve damage, which could be as serious as a permanent spinal cord injury with  paralysis.       9. Death:  Although rare, severe deadly allergic reactions known as "Anaphylactic  reaction" can occur to any of the medications used.      10. Worsening of the symptoms:  We can always make thing worse.  What are the chances of something like this happening? Chances of any of this occuring are extremely low.  By statistics, you have more of a chance of getting killed in a motor vehicle accident: while driving to the hospital than any of the above occurring .  Nevertheless, you should be aware that they are possibilities.  In general, it is similar to taking a shower.  Everybody knows that you can slip, hit your head and get killed.  Does that mean that you should not shower again?  Nevertheless always keep in mind that statistics do not mean anything if you happen to be on the wrong side of them.  Even if a procedure has a 1 (one) in a 1,000,000 (million) chance of going wrong, it you happen to be that one..Also, keep in mind that by statistics, you have more of a chance of having something go wrong when taking medications.  Who should not have this procedure? If you are on a blood thinning medication (e.g. Coumadin, Plavix, see list of "Blood Thinners"), or if you have an active infection going on, you should not  have the procedure.  If you are taking any blood thinners, please inform your physician.  How should I prepare for this procedure?  Do not eat or drink anything at least six hours prior to the procedure.  Bring a driver with you .  It cannot be a taxi.  Come accompanied by an adult that can drive you back, and that is strong enough to help you if your legs get weak or numb from the local anesthetic.  Take all of your medicines the morning of the procedure with just enough water to swallow them.  If you have diabetes, make sure that you are scheduled to have your procedure done first thing in the morning, whenever possible.  If you have diabetes,   take only half of your insulin dose and notify our nurse that you have done so as soon as you arrive at the clinic.  If you are diabetic, but only take blood sugar pills (oral hypoglycemic), then do not take them on the morning of your procedure.  You may take them after you have had the procedure.  Do not take aspirin or any aspirin-containing medications, at least eleven (11) days prior to the procedure.  They may prolong bleeding.  Wear loose fitting clothing that may be easy to take off and that you would not mind if it got stained with Betadine or blood.  Do not wear any jewelry or perfume  Remove any nail coloring.  It will interfere with some of our monitoring equipment.  NOTE: Remember that this is not meant to be interpreted as a complete list of all possible complications.  Unforeseen problems may occur.  BLOOD THINNERS The following drugs contain aspirin or other products, which can cause increased bleeding during surgery and should not be taken for 2 weeks prior to and 1 week after surgery.  If you should need take something for relief of minor pain, you may take acetaminophen which is found in Tylenol,m Datril, Anacin-3 and Panadol. It is not blood thinner. The products listed below are.  Do not take any of the products listed below  in addition to any listed on your instruction sheet.  A.P.C or A.P.C with Codeine Codeine Phosphate Capsules #3 Ibuprofen Ridaura  ABC compound Congesprin Imuran rimadil  Advil Cope Indocin Robaxisal  Alka-Seltzer Effervescent Pain Reliever and Antacid Coricidin or Coricidin-D  Indomethacin Rufen  Alka-Seltzer plus Cold Medicine Cosprin Ketoprofen S-A-C Tablets  Anacin Analgesic Tablets or Capsules Coumadin Korlgesic Salflex  Anacin Extra Strength Analgesic tablets or capsules CP-2 Tablets Lanoril Salicylate  Anaprox Cuprimine Capsules Levenox Salocol  Anexsia-D Dalteparin Magan Salsalate  Anodynos Darvon compound Magnesium Salicylate Sine-off  Ansaid Dasin Capsules Magsal Sodium Salicylate  Anturane Depen Capsules Marnal Soma  APF Arthritis pain formula Dewitt's Pills Measurin Stanback  Argesic Dia-Gesic Meclofenamic Sulfinpyrazone  Arthritis Bayer Timed Release Aspirin Diclofenac Meclomen Sulindac  Arthritis pain formula Anacin Dicumarol Medipren Supac  Analgesic (Safety coated) Arthralgen Diffunasal Mefanamic Suprofen  Arthritis Strength Bufferin Dihydrocodeine Mepro Compound Suprol  Arthropan liquid Dopirydamole Methcarbomol with Aspirin Synalgos  ASA tablets/Enseals Disalcid Micrainin Tagament  Ascriptin Doan's Midol Talwin  Ascriptin A/D Dolene Mobidin Tanderil  Ascriptin Extra Strength Dolobid Moblgesic Ticlid  Ascriptin with Codeine Doloprin or Doloprin with Codeine Momentum Tolectin  Asperbuf Duoprin Mono-gesic Trendar  Aspergum Duradyne Motrin or Motrin IB Triminicin  Aspirin plain, buffered or enteric coated Durasal Myochrisine Trigesic  Aspirin Suppositories Easprin Nalfon Trillsate  Aspirin with Codeine Ecotrin Regular or Extra Strength Naprosyn Uracel  Atromid-S Efficin Naproxen Ursinus  Auranofin Capsules Elmiron Neocylate Vanquish  Axotal Emagrin Norgesic Verin  Azathioprine Empirin or Empirin with Codeine Normiflo Vitamin E  Azolid Emprazil Nuprin Voltaren  Bayer  Aspirin plain, buffered or children's or timed BC Tablets or powders Encaprin Orgaran Warfarin Sodium  Buff-a-Comp Enoxaparin Orudis Zorpin  Buff-a-Comp with Codeine Equegesic Os-Cal-Gesic   Buffaprin Excedrin plain, buffered or Extra Strength Oxalid   Bufferin Arthritis Strength Feldene Oxphenbutazone   Bufferin plain or Extra Strength Feldene Capsules Oxycodone with Aspirin   Bufferin with Codeine Fenoprofen Fenoprofen Pabalate or Pabalate-SF   Buffets II Flogesic Panagesic   Buffinol plain or Extra Strength Florinal or Florinal with Codeine Panwarfarin   Buf-Tabs Flurbiprofen Penicillamine   Butalbital Compound Four-way cold tablets   Penicillin   Butazolidin Fragmin Pepto-Bismol   Carbenicillin Geminisyn Percodan   Carna Arthritis Reliever Geopen Persantine   Carprofen Gold's salt Persistin   Chloramphenicol Goody's Phenylbutazone   Chloromycetin Haltrain Piroxlcam   Clmetidine heparin Plaquenil   Cllnoril Hyco-pap Ponstel   Clofibrate Hydroxy chloroquine Propoxyphen         Before stopping any of these medications, be sure to consult the physician who ordered them.  Some, such as Coumadin (Warfarin) are ordered to prevent or treat serious conditions such as "deep thrombosis", "pumonary embolisms", and other heart problems.  The amount of time that you may need off of the medication may also vary with the medication and the reason for which you were taking it.  If you are taking any of these medications, please make sure you notify your pain physician before you undergo any procedures.         Epidural Steroid Injection Patient Information  Description: The epidural space surrounds the nerves as they exit the spinal cord.  In some patients, the nerves can be compressed and inflamed by a bulging disc or a tight spinal canal (spinal stenosis).  By injecting steroids into the epidural space, we can bring irritated nerves into direct contact with a potentially helpful medication.   These steroids act directly on the irritated nerves and can reduce swelling and inflammation which often leads to decreased pain.  Epidural steroids may be injected anywhere along the spine and from the neck to the low back depending upon the location of your pain.   After numbing the skin with local anesthetic (like Novocaine), a small needle is passed into the epidural space slowly.  You may experience a sensation of pressure while this is being done.  The entire block usually last less than 10 minutes.  Conditions which may be treated by epidural steroids:   Low back and leg pain  Neck and arm pain  Spinal stenosis  Post-laminectomy syndrome  Herpes zoster (shingles) pain  Pain from compression fractures  Preparation for the injection:  1. Do not eat any solid food or dairy products within 8 hours of your appointment.  2. You may drink clear liquids up to 3 hours before appointment.  Clear liquids include water, black coffee, juice or soda.  No milk or cream please. 3. You may take your regular medication, including pain medications, with a sip of water before your appointment  Diabetics should hold regular insulin (if taken separately) and take 1/2 normal NPH dos the morning of the procedure.  Carry some sugar containing items with you to your appointment. 4. A driver must accompany you and be prepared to drive you home after your procedure.  5. Bring all your current medications with your. 6. An IV may be inserted and sedation may be given at the discretion of the physician.   7. A blood pressure cuff, EKG and other monitors will often be applied during the procedure.  Some patients may need to have extra oxygen administered for a short period. 8. You will be asked to provide medical information, including your allergies, prior to the procedure.  We must know immediately if you are taking blood thinners (like Coumadin/Warfarin)  Or if you are allergic to IV iodine contrast (dye). We  must know if you could possible be pregnant.  Possible side-effects:  Bleeding from needle site  Infection (rare, may require surgery)  Nerve injury (rare)  Numbness & tingling (temporary)  Difficulty urinating (rare, temporary)  Spinal headache (   a headache worse with upright posture)  Light -headedness (temporary)  Pain at injection site (several days)  Decreased blood pressure (temporary)  Weakness in arm/leg (temporary)  Pressure sensation in back/neck (temporary)  Call if you experience:  Fever/chills associated with headache or increased back/neck pain.  Headache worsened by an upright position.  New onset weakness or numbness of an extremity below the injection site  Hives or difficulty breathing (go to the emergency room)  Inflammation or drainage at the infection site  Severe back/neck pain  Any new symptoms which are concerning to you  Please note:  Although the local anesthetic injected can often make your back or neck feel good for several hours after the injection, the pain will likely return.  It takes 3-7 days for steroids to work in the epidural space.  You may not notice any pain relief for at least that one week.  If effective, we will often do a series of three injections spaced 3-6 weeks apart to maximally decrease your pain.  After the initial series, we generally will wait several months before considering a repeat injection of the same type.  If you have any questions, please call [phone] [facility] 

## 2015-09-01 NOTE — Progress Notes (Signed)
Safety precautions to be maintained throughout the outpatient stay will include: orient to surroundings, keep bed in low position, maintain call bell within reach at all times, provide assistance with transfer out of bed and ambulation.  

## 2015-09-01 NOTE — Progress Notes (Signed)
Subjective:  Patient ID: Deborah Poole, female    DOB: 05-08-1935  Age: 80 y.o. MRN: [id number]  CC: Back Pain; Hip Pain; and Neck Pain  Procedure: L5-S1 epidural #2 under fluoroscopic guidance and moderate sedation  HPI Frederika Wetzler presents for reevaluation today. She was last seen 1 month ago. She had her first epidural at that time and states that she had excellent relief. She had approximate 70% relief in her low back pain and 70 radial percent relief in her lower extremity pain. She has had some recurrence which is of the same quality and characteristic as previously experienced. Otherwise no changes are noted today and she desires to proceed with a repeat epidural. History Eliana has a past medical history of Thyroid disease; CHF (congestive heart failure) ([place]); Cataract; Breast cancer ([place]); Heart attack ([place]); COPD (chronic obstructive pulmonary disease) ([place]); Irritable bowel syndrome (IBS); and Hypertension.   She has past surgical history that includes Cholecystectomy; Breast surgery; Cardiac surgery; Eye surgery; STENT PLACEMENT VASCULAR ([place] [facility]); Rotator cuff repair; and Abdominal hysterectomy.   Her family history includes Hypertension in her other.She reports that she has quit smoking. She does not have any smokeless tobacco history on file. She reports that she drinks alcohol. She reports that she does not use illicit drugs.   ---------------------------------------------------------------------------------------------------------------------- Past Medical History  Diagnosis Date  . Thyroid disease   . CHF (congestive heart failure) ([place])   . Cataract   . Breast cancer ([place])   . Heart attack ([place])   . COPD (chronic obstructive pulmonary disease) ([place])   . Irritable bowel syndrome (IBS)   . Hypertension     Past Surgical History  Procedure Laterality Date  . Cholecystectomy    . Breast surgery    . Cardiac surgery    . Eye surgery    . Stent placement vascular ([facility] hx)     . Rotator cuff repair    . Abdominal hysterectomy      Family History  Problem Relation Age of Onset  . Hypertension Other     Social History  Substance Use Topics  . Smoking status: Former Research scientist (life sciences)  . Smokeless tobacco: Not on file  . Alcohol Use: 0.0 oz/week    0 Standard drinks or equivalent per week     Comment: once a month or so     ---------------------------------------------------------------------------------------------------------------------- Social History   Social History  . Marital Status: Married    Spouse Name: N/A  . Number of Children: N/A  . Years of Education: N/A   Social History Main Topics  . Smoking status: Former Research scientist (life sciences)  . Smokeless tobacco: None  . Alcohol Use: 0.0 oz/week    0 Standard drinks or equivalent per week     Comment: once a month or so   . Drug Use: No  . Sexual Activity: Not Asked   Other Topics Concern  . None   Social History Narrative      ----------------------------------------------------------------------------------------------------------------------  ROS Review of Systems  Cardiac: History of high blood pressure [place] MI heart surgery with stance and congestive heart failure Pulmonary: COPD history of shortness of breath bronchitis and lung problems Neurologic: Negative Psychologic: Anxiety depression panic attacks Gastrointestinal: Reflux and IBS GU: Diminished kidney function   Objective:  BP 143/84 mmHg  Pulse 85  Temp(Src) 98.6 F (37 C)  Resp 20  Ht 5' (1.524 m)  Wt 193 lb (87.544 kg)  BMI 37.69 kg/m2  SpO2 93%  Physical Exam  Patient is alert  and oriented cooperative and compliant Pupils are equally round reactive light EOMI Heart is regular rate and rhythm without murmur Lungs are clear to also dictation without wheezing Inspection low back reveals some mild paraspinous muscle tenderness but no overt trigger points   Assessment & Plan:   Verga was seen today for back pain, hip pain and  neck pain.  Diagnoses and all orders for this visit:  DDD (degenerative disc disease), lumbar  Sciatica associated with disorder of lumbar spine, left  Other orders -     iopamidol (ISOVUE-M) 41 % intrathecal injection;  -     lidocaine (PF) (XYLOCAINE) 1 % injection;  -     ropivacaine (PF) 2 mg/ml (0.2%) (NAROPIN) 2 MG/ML epidural;  -     triamcinolone acetonide (KENALOG-40) 40 MG/ML injection;  -     LUMBAR EPIDURAL STEROID INJECTION; Future     ----------------------------------------------------------------------------------------------------------------------  Problem List Items Addressed This Visit    None    Visit Diagnoses    DDD (degenerative disc disease), lumbar    -  Primary    Relevant Medications    triamcinolone acetonide (KENALOG-40) 40 MG/ML injection (Completed)    Sciatica associated with disorder of lumbar spine, left           ----------------------------------------------------------------------------------------------------------------------  1. DDD (degenerative disc disease), lumbar We'll plan on her Second epidural steroid injection today. The risks and benefits been reviewed with her in full detail and all questions answered. We'll attempt to do this at the L4-5 interspace and we will have her return to clinic in 1 month for reevaluation possible repeat injection - LUMBAR EPIDURAL STEROID INJECTION - LUMBAR EPIDURAL STEROID INJECTION; Future - triamcinolone acetonide (KENALOG-40) injection 40 mg; 1 mL (40 mg total) by Other route once. - sodium chloride flush (NS) 0.9 % injection 10 mL; 10 mLs by Other route once. - ropivacaine (PF) 2 mg/ml (0.2%) (NAROPIN) epidural 10 mL; 10 mLs by Epidural route once. - lidocaine (PF) (XYLOCAINE) 1 % injection 5 mL; Inject 5 mLs into the skin once.  2. Sciatica associated with disorder of lumbar spine, left Same as 1. She also may be a candidate for a bilateral facet block. Is more we have discussed options  regarding physical therapy with efforts at weight loss and or strengthening - LUMBAR EPIDURAL STEROID INJECTION - LUMBAR EPIDURAL STEROID INJECTION; Future - triamcinolone acetonide (KENALOG-40) injection 40 mg; 1 mL (40 mg total) by Other route once. - sodium chloride flush (NS) 0.9 % injection 10 mL; 10 mLs by Other route once. - ropivacaine (PF) 2 mg/ml (0.2%) (NAROPIN) epidural 10 mL; 10 mLs by Epidural route once. - lidocaine (PF) (XYLOCAINE) 1 % injection 5 mL; Inject 5 mLs into the skin once.    ----------------------------------------------------------------------------------------------------------------------  I am having Ms. Robben maintain her carvedilol, omeprazole, simvastatin, spironolactone, ALPRAZolam, cholecalciferol, Magnesium, NIFEdipine, oyster calcium, albuterol, oxycodone-acetaminophen, VOLTAREN, HYDROcodone-acetaminophen, oxyCODONE-acetaminophen, fluticasone, Biotin, Melatonin, and ibuprofen. We administered iopamidol, lidocaine (PF), ropivacaine (PF) 2 mg/ml (0.2%), and triamcinolone acetonide. We will continue to administer triamcinolone acetonide, sodium chloride flush, ropivacaine (PF) 2 mg/ml (0.2%), and lidocaine (PF).   Meds ordered this encounter  Medications  . iopamidol (ISOVUE-M) 41 % intrathecal injection    Sig:     TICE, KORI: cabinet override  . lidocaine (PF) (XYLOCAINE) 1 % injection    Sig:     TICE, KORI: cabinet override  . ropivacaine (PF) 2 mg/ml (0.2%) (NAROPIN) 2 MG/ML epidural    Sig:     TICE,  KORI: cabinet override  . triamcinolone acetonide (KENALOG-40) 40 MG/ML injection    Sig:     TICE, KORI: cabinet override    Procedure:  Procedure: L5 S1 LESI # 2 of series with fluoroscopic guidance and without moderate sedation  NOTE: The risks, benefits, and expectations of the procedure have been discussed and explained to the patient who was understanding and in agreement with suggested treatment plan. No guarantees were  made.  DESCRIPTION OF PROCEDURE: Lumbar epidural steroid injection with, EKG, blood pressure, pulse, and pulse oximetry monitoring. The procedure was performed with the patient in the prone position under fluoroscopic guidance. A local anesthetic skin wheal of 1.5% plain lidocaine was performed at the appropriate site after fluoroscopic identifictation  Using strict aseptic technique, I then advanced an 18-gauge Tuohy epidural needle in the midline via loss-of-resistance to saline. There was negative aspiration for heme or  CSF.  I then confirmed position with both AP and Lateral fluoroscan. 2 cc of Isovue was injected getting good epidural spread and at L 5 S1  A total of 5 mL of Preservative-Free normal saline with 40 mg of Kenalog and 1cc Ropicaine 0.2 percent was injected incrementally via the  epidurally placed needle. Needle removed. The patient tolerated the injection well and was convalesced and discharged to home in stable condition. If the patient has any post procedure difficulty they have been instructed on how to contact us for assistance.     Molli Barrows, MD

## 2015-09-04 ENCOUNTER — Telehealth: Payer: Self-pay

## 2015-09-04 NOTE — Telephone Encounter (Signed)
Left message- instructed to call if needed

## 2015-09-05 ENCOUNTER — Ambulatory Visit
Admission: RE | Admit: 2015-09-05 | Discharge: 2015-09-05 | Disposition: A | Discharge status: Home / Self Care | Payer: Medicare Other | Source: Ambulatory Visit | Attending: Orthopedic Surgery | Admitting: Orthopedic Surgery

## 2015-09-05 DIAGNOSIS — M50222 Other cervical disc displacement at C5-C6 level: Secondary | ICD-10-CM | POA: Insufficient documentation

## 2015-09-05 DIAGNOSIS — M5412 Radiculopathy, cervical region: Secondary | ICD-10-CM | POA: Diagnosis not present

## 2015-09-05 DIAGNOSIS — M47892 Other spondylosis, cervical region: Secondary | ICD-10-CM | POA: Diagnosis not present

## 2015-09-05 DIAGNOSIS — M50221 Other cervical disc displacement at C4-C5 level: Secondary | ICD-10-CM | POA: Diagnosis not present

## 2015-09-07 DIAGNOSIS — M5412 Radiculopathy, cervical region: Secondary | ICD-10-CM | POA: Diagnosis not present

## 2015-10-11 ENCOUNTER — Ambulatory Visit: Payer: Medicare Other | Admitting: Anesthesiology

## 2015-10-16 ENCOUNTER — Ambulatory Visit: Payer: Medicare Other | Attending: Anesthesiology | Admitting: Anesthesiology

## 2015-10-16 ENCOUNTER — Encounter: Payer: Self-pay | Admitting: Anesthesiology

## 2015-10-16 VITALS — BP 133/84 | HR 80 | Temp 97.8°F | Resp 22 | Ht 60.0 in | Wt 191.0 lb

## 2015-10-16 DIAGNOSIS — M549 Dorsalgia, unspecified: Secondary | ICD-10-CM | POA: Diagnosis present

## 2015-10-16 DIAGNOSIS — I10 Essential (primary) hypertension: Secondary | ICD-10-CM | POA: Insufficient documentation

## 2015-10-16 DIAGNOSIS — M5136 Other intervertebral disc degeneration, lumbar region: Secondary | ICD-10-CM

## 2015-10-16 DIAGNOSIS — M5432 Sciatica, left side: Secondary | ICD-10-CM | POA: Diagnosis not present

## 2015-10-16 DIAGNOSIS — M5442 Lumbago with sciatica, left side: Secondary | ICD-10-CM | POA: Insufficient documentation

## 2015-10-16 DIAGNOSIS — J449 Chronic obstructive pulmonary disease, unspecified: Secondary | ICD-10-CM | POA: Insufficient documentation

## 2015-10-16 DIAGNOSIS — Z87891 Personal history of nicotine dependence: Secondary | ICD-10-CM | POA: Insufficient documentation

## 2015-10-16 DIAGNOSIS — M545 Low back pain: Secondary | ICD-10-CM | POA: Diagnosis not present

## 2015-10-16 DIAGNOSIS — I509 Heart failure, unspecified: Secondary | ICD-10-CM | POA: Insufficient documentation

## 2015-10-16 DIAGNOSIS — K589 Irritable bowel syndrome without diarrhea: Secondary | ICD-10-CM | POA: Diagnosis not present

## 2015-10-16 DIAGNOSIS — F418 Other specified anxiety disorders: Secondary | ICD-10-CM | POA: Diagnosis not present

## 2015-10-16 DIAGNOSIS — K219 Gastro-esophageal reflux disease without esophagitis: Secondary | ICD-10-CM | POA: Insufficient documentation

## 2015-10-16 MED ORDER — SODIUM CHLORIDE 0.9 % IJ SOLN
INTRAMUSCULAR | Status: AC
  Administered 2015-10-16: 15:00:00
  Filled 2015-10-16: qty 20

## 2015-10-16 MED ORDER — TRIAMCINOLONE ACETONIDE 40 MG/ML IJ SUSP
INTRAMUSCULAR | Status: AC
  Administered 2015-10-16: 15:00:00
  Filled 2015-10-16: qty 1

## 2015-10-16 MED ORDER — SODIUM CHLORIDE 0.9% FLUSH: 10.0000 mL | Freq: Once | INTRAVENOUS | Status: AC

## 2015-10-16 MED ORDER — LACTATED RINGERS IV SOLN: 1000.0000 mL | INTRAVENOUS | Status: AC

## 2015-10-16 MED ORDER — LIDOCAINE HCL (PF) 1 % IJ SOLN: 5.0000 mL | Freq: Once | INTRAMUSCULAR | Status: AC

## 2015-10-16 MED ORDER — TRIAMCINOLONE ACETONIDE 40 MG/ML IJ SUSP: 40.0000 mg | Freq: Once | INTRAMUSCULAR | Status: AC

## 2015-10-16 MED ORDER — ROPIVACAINE HCL 2 MG/ML IJ SOLN
INTRAMUSCULAR | Status: AC
  Administered 2015-10-16: 15:00:00
  Filled 2015-10-16: qty 10

## 2015-10-16 MED ORDER — MIDAZOLAM HCL 5 MG/5ML IJ SOLN: 5.0000 mg | Freq: Once | INTRAMUSCULAR | Status: AC

## 2015-10-16 MED ORDER — LIDOCAINE HCL (PF) 1 % IJ SOLN
INTRAMUSCULAR | Status: AC
  Administered 2015-10-16: 15:00:00
  Filled 2015-10-16: qty 5

## 2015-10-16 MED ORDER — IOPAMIDOL (ISOVUE-M 200) INJECTION 41%
INTRAMUSCULAR | Status: AC
  Administered 2015-10-16: 15:00:00
  Filled 2015-10-16: qty 10

## 2015-10-16 MED ORDER — ROPIVACAINE HCL 2 MG/ML IJ SOLN: 10.0000 mL | Freq: Once | INTRAMUSCULAR | Status: AC

## 2015-10-16 MED ORDER — IOPAMIDOL (ISOVUE-M 200) INJECTION 41%: 20.0000 mL | Freq: Once | INTRAMUSCULAR | Status: AC | PRN

## 2015-10-16 NOTE — Progress Notes (Signed)
Safety precautions to be maintained throughout the outpatient stay will include: orient to surroundings, keep bed in low position, maintain call bell within reach at all times, provide assistance with transfer out of bed and ambulation.  

## 2015-10-16 NOTE — Patient Instructions (Signed)
Epidural Steroid Injection An epidural steroid injection is given to relieve pain in your neck, back, or legs that is caused by the irritation or swelling of a nerve root. This procedure involves injecting a steroid and numbing medicine (anesthetic) into the epidural space. The epidural space is the space between the outer covering of your spinal cord and the bones that form your backbone (vertebra).  LET YOUR HEALTH CARE PROVIDER KNOW ABOUT:   Any allergies you have.  All medicines you are taking, including vitamins, herbs, eye drops, creams, and over-the-counter medicines such as aspirin.  Previous problems you or members of your family have had with the use of anesthetics.  Any blood disorders or blood clotting disorders you have.  Previous surgeries you have had.  Medical conditions you have. RISKS AND COMPLICATIONS Generally, this is a safe procedure. However, as with any procedure, complications can occur. Possible complications of epidural steroid injection include:  Headache.  Bleeding.  Infection.  Allergic reaction to the medicines.  Damage to your nerves. The response to this procedure depends on the underlying cause of the pain and its duration. People who have long-term (chronic) pain are less likely to benefit from epidural steroids than are those people whose pain comes on strong and suddenly. BEFORE THE PROCEDURE   Ask your health care provider about changing or stopping your regular medicines. You may be advised to stop taking blood-thinning medicines a few days before the procedure.  You may be given medicines to reduce anxiety.  Arrange for someone to take you home after the procedure. PROCEDURE   You will remain awake during the procedure. You may receive medicine to make you relaxed.  You will be asked to lie on your stomach.  The injection site will be cleaned.  The injection site will be numbed with a medicine (local anesthetic).  A needle will be  injected through your skin into the epidural space.  Your health care provider will use an X-ray machine to ensure that the steroid is delivered closest to the affected nerve. You may have minimal discomfort at this time.  Once the needle is in the right position, the local anesthetic and the steroid will be injected into the epidural space.  The needle will then be removed and a bandage will be applied to the injection site. AFTER THE PROCEDURE   You may be monitored for a short time before you go home.  You may feel weakness or numbness in your arm or leg, which disappears within hours.  You may be allowed to eat, drink, and take your regular medicine.  You may have soreness at the site of the injection.   This information is not intended to replace advice given to you by your health care provider. Make sure you discuss any questions you have with your health care provider.   Document Released: 06/25/2007 Document Revised: 11/18/2012 Document Reviewed: 09/04/2012 Elsevier Interactive Patient Education 2016 [employer] Pain Management Discharge Instructions  General Discharge Instructions :  If you need to reach your doctor call: Monday-Friday 8:00 am - 4:00 pm at [phone] or toll free [phone].  After clinic hours [phone] to have operator reach doctor.  Bring all of your medication bottles to all your appointments in the pain clinic.  To cancel or reschedule your appointment with Pain Management please remember to call 24 hours in advance to avoid a fee.  Refer to the educational materials which you have been given on: General Risks, I had my Procedure.   Discharge Instructions, Post Sedation.  Post Procedure Instructions:  The drugs you were given will stay in your system until tomorrow, so for the next 24 hours you should not drive, make any legal decisions or drink any alcoholic beverages.  You may eat anything you prefer, but it is better to start with  liquids then soups and crackers, and gradually work up to solid foods.  Please notify your doctor immediately if you have any unusual bleeding, trouble breathing or pain that is not related to your normal pain.  Depending on the type of procedure that was done, some parts of your body may feel week and/or numb.  This usually clears up by tonight or the next day.  Walk with the use of an assistive device or accompanied by an adult for the 24 hours.  You may use ice on the affected area for the first 24 hours.  Put ice in a Ziploc bag and cover with a towel and place against area 15 minutes on 15 minutes off.  You may switch to heat after 24 hours. 

## 2015-10-17 NOTE — Progress Notes (Signed)
Subjective:  Patient ID: Deborah Poole, female    DOB: 1936-03-04  Age: 80 y.o. MRN: [id number]  CC: Back Pain  Procedure: L5-S1 epidural #3  under fluoroscopic guidance and moderate sedation  HPI Deborah Poole presents for reevaluation. She was last seen approximately 1 month ago and had her second epidural at that time. She states that her lower extremity pain has resolved and she's had a 75% reduction in her low back pain. The pain that she does admit to is of the same quality characteristic distribution but diminished in intensity. Otherwise she is doing her physical therapy exercises and sleeping better at night. No other changes are mentioned today.  History Deborah Poole has a past medical history of Thyroid disease; CHF (congestive heart failure) ([place]); Cataract; Breast cancer ([place]); Heart attack ([place]); COPD (chronic obstructive pulmonary disease) ([place]); Irritable bowel syndrome (IBS); and Hypertension.   She has past surgical history that includes Cholecystectomy; Breast surgery; Cardiac surgery; Eye surgery; STENT PLACEMENT VASCULAR ([facility]); Rotator cuff repair; and Abdominal hysterectomy.   Her family history includes Hypertension in her other.She reports that she has quit smoking. She does not have any smokeless tobacco history on file. She reports that she drinks alcohol. She reports that she does not use illicit drugs.   ---------------------------------------------------------------------------------------------------------------------- Past Medical History  Diagnosis Date  . Thyroid disease   . CHF (congestive heart failure) ([place])   . Cataract   . Breast cancer ([place])   . Heart attack ([place])   . COPD (chronic obstructive pulmonary disease) ([place])   . Irritable bowel syndrome (IBS)   . Hypertension     Past Surgical History  Procedure Laterality Date  . Cholecystectomy    . Breast surgery    . Cardiac surgery    . Eye surgery    . Stent placement vascular ([facility])    . Rotator  cuff repair    . Abdominal hysterectomy      Family History  Problem Relation Age of Onset  . Hypertension Other     Social History  Substance Use Topics  . Smoking status: Former Research scientist (life sciences)  . Smokeless tobacco: Not on file  . Alcohol Use: 0.0 oz/week    0 Standard drinks or equivalent per week     Comment: once a month or so     ---------------------------------------------------------------------------------------------------------------------- Social History   Social History  . Marital Status: Married    Spouse Name: N/A  . Number of Children: N/A  . Years of Education: N/A   Social History Main Topics  . Smoking status: Former Research scientist (life sciences)  . Smokeless tobacco: None  . Alcohol Use: 0.0 oz/week    0 Standard drinks or equivalent per week     Comment: once a month or so   . Drug Use: No  . Sexual Activity: Not Asked   Other Topics Concern  . None   Social History Narrative      ----------------------------------------------------------------------------------------------------------------------  ROS Review of Systems  Cardiac: History of high blood pressure [place] MI heart surgery with stance and congestive heart failure Pulmonary: COPD history of shortness of breath bronchitis and lung problems Neurologic: Negative Psychologic: Anxiety depression panic attacks Gastrointestinal: Reflux and IBS GU: Diminished kidney function   Objective:  BP 133/84 mmHg  Pulse 80  Temp(Src) 97.8 F (36.6 C) (Oral)  Resp 22  Ht 5' (1.524 m)  Wt 191 lb (86.637 kg)  BMI 37.30 kg/m2  SpO2 96%  Physical Exam  Patient is alert and oriented cooperative  and compliant Pupils are equally round reactive light EOMI Heart is regular rate and rhythm without murmur Lungs are clear to also dictation without wheezing Inspection low back reveals some mild paraspinous muscle tenderness but no overt trigger points   Assessment & Plan:   Deborah Poole was seen today for back pain.  Diagnoses  and all orders for this visit:  DDD (degenerative disc disease), lumbar  Sciatica associated with disorder of lumbar spine, left -     triamcinolone acetonide (KENALOG-40) injection 40 mg; 1 mL (40 mg total) by Other route once. -     sodium chloride flush (NS) 0.9 % injection 10 mL; 10 mLs by Other route once. -     ropivacaine (PF) 2 mg/ml (0.2%) (NAROPIN) epidural 10 mL; 10 mLs by Epidural route once. -     midazolam (VERSED) 5 MG/5ML injection 5 mg; Inject 5 mLs (5 mg total) into the vein once. -     lidocaine (PF) (XYLOCAINE) 1 % injection 5 mL; Inject 5 mLs into the skin once. -     lactated ringers infusion 1,000 mL; Inject 1,000 mLs into the vein continuous. -     iopamidol (ISOVUE-M) 41 % intrathecal injection 20 mL; 20 mLs by Other route once as needed for contrast.  Other orders -     iopamidol (ISOVUE-M) 41 % intrathecal injection;  -     sodium chloride 0.9 % injection;  -     ropivacaine (PF) 2 mg/ml (0.2%) (NAROPIN) 2 MG/ML epidural;  -     triamcinolone acetonide (KENALOG-40) 40 MG/ML injection;  -     lidocaine (PF) (XYLOCAINE) 1 % injection;      ----------------------------------------------------------------------------------------------------------------------  Problem List Items Addressed This Visit    None    Visit Diagnoses    DDD (degenerative disc disease), lumbar    -  Primary    Relevant Medications    triamcinolone acetonide (KENALOG-40) injection 40 mg    triamcinolone acetonide (KENALOG-40) 40 MG/ML injection (Completed)    Sciatica associated with disorder of lumbar spine, left        Relevant Medications    triamcinolone acetonide (KENALOG-40) injection 40 mg    sodium chloride flush (NS) 0.9 % injection 10 mL    ropivacaine (PF) 2 mg/ml (0.2%) (NAROPIN) epidural 10 mL    midazolam (VERSED) 5 MG/5ML injection 5 mg    lidocaine (PF) (XYLOCAINE) 1 % injection 5 mL    lactated ringers infusion 1,000 mL    iopamidol (ISOVUE-M) 41 % intrathecal  injection 20 mL       ----------------------------------------------------------------------------------------------------------------------  1. DDD (degenerative disc disease), lumbar We'll plan on herThird epidural steroid injection today. The risks and benefits been reviewed with her in full detail and all questions answered. We'll attempt to do this at the L 5 S1 interspace and we will have her return to clinic in 2 month for reevaluation  - LUMBAR EPIDURAL STEROID INJECTION -  - triamcinolone acetonide (KENALOG-40) injection 40 mg; 1 mL (40 mg total) by Other route once. - sodium chloride flush (NS) 0.9 % injection 10 mL; 10 mLs by Other route once. - ropivacaine (PF) 2 mg/ml (0.2%) (NAROPIN) epidural 10 mL; 10 mLs by Epidural route once. - lidocaine (PF) (XYLOCAINE) 1 % injection 5 mL; Inject 5 mLs into the skin once.  2. Sciatica associated with disorder of lumbar spine, left Same as 1. She also may be a candidate for a bilateral facet block. Is more we have discussed options  regarding physical therapy with efforts at weight loss and or strengthening - LUMBAR EPIDURAL STEROID INJECTION - - triamcinolone acetonide (KENALOG-40) injection 40 mg; 1 mL (40 mg total) by Other route once. - sodium chloride flush (NS) 0.9 % injection 10 mL; 10 mLs by Other route once. - ropivacaine (PF) 2 mg/ml (0.2%) (NAROPIN) epidural 10 mL; 10 mLs by Epidural route once. - lidocaine (PF) (XYLOCAINE) 1 % injection 5 mL; Inject 5 mLs into the skin once.    ----------------------------------------------------------------------------------------------------------------------  I am having Ms. Huseby maintain her carvedilol, omeprazole, simvastatin, spironolactone, ALPRAZolam, cholecalciferol, Magnesium, NIFEdipine, oyster calcium, albuterol, oxycodone-acetaminophen, VOLTAREN, HYDROcodone-acetaminophen, oxyCODONE-acetaminophen, fluticasone, Biotin, Melatonin, and ibuprofen. We administered iopamidol, sodium  chloride, ropivacaine (PF) 2 mg/ml (0.2%), triamcinolone acetonide, and lidocaine (PF). We will continue to administer triamcinolone acetonide, sodium chloride flush, ropivacaine (PF) 2 mg/ml (0.2%), lidocaine (PF), triamcinolone acetonide, sodium chloride flush, ropivacaine (PF) 2 mg/ml (0.2%), midazolam, lidocaine (PF), lactated ringers, and iopamidol.   Meds ordered this encounter  Medications  . triamcinolone acetonide (KENALOG-40) injection 40 mg    Sig:   . sodium chloride flush (NS) 0.9 % injection 10 mL    Sig:   . ropivacaine (PF) 2 mg/ml (0.2%) (NAROPIN) epidural 10 mL    Sig:   . midazolam (VERSED) 5 MG/5ML injection 5 mg    Sig:   . lidocaine (PF) (XYLOCAINE) 1 % injection 5 mL    Sig:   . lactated ringers infusion 1,000 mL    Sig:   . iopamidol (ISOVUE-M) 41 % intrathecal injection 20 mL    Sig:   . iopamidol (ISOVUE-M) 41 % intrathecal injection    Sig:     GARNER, CYNTHIA: cabinet override  . sodium chloride 0.9 % injection    Sig:     GARNER, CYNTHIA: cabinet override  . ropivacaine (PF) 2 mg/ml (0.2%) (NAROPIN) 2 MG/ML epidural    Sig:     GARNER, CYNTHIA: cabinet override  . triamcinolone acetonide (KENALOG-40) 40 MG/ML injection    Sig:     GARNER, CYNTHIA: cabinet override  . lidocaine (PF) (XYLOCAINE) 1 % injection    Sig:     GARNER, CYNTHIA: cabinet override    Procedure:  Procedure: L5 S1 LESI # 3 of series with fluoroscopic guidance and without moderate sedation  NOTE: The risks, benefits, and expectations of the procedure have been discussed and explained to the patient who was understanding and in agreement with suggested treatment plan. No guarantees were made.  DESCRIPTION OF PROCEDURE: Lumbar epidural steroid injection with, EKG, blood pressure, pulse, and pulse oximetry monitoring. The procedure was performed with the patient in the prone position under fluoroscopic guidance. A local anesthetic skin wheal of 1.5% plain lidocaine was performed at  the appropriate site after fluoroscopic identifictation  Using strict aseptic technique, I then advanced an 18-gauge Tuohy epidural needle in the midline via loss-of-resistance to saline. There was negative aspiration for heme or  CSF.  I then confirmed position with both AP and Lateral fluoroscan. 2 cc of Isovue was injected getting good epidural spread and at L 5 S1  A total of 5 mL of Preservative-Free normal saline with 40 mg of Kenalog and 1cc Ropicaine 0.2 percent was injected incrementally via the  epidurally placed needle. Needle removed. The patient tolerated the injection well and was convalesced and discharged to home in stable condition. If the patient has any post procedure difficulty they have been instructed on how to contact us for assistance.  Molli Barrows, MD

## 2015-11-21 ENCOUNTER — Telehealth: Payer: Self-pay | Admitting: *Deleted

## 2015-12-07 ENCOUNTER — Encounter: Payer: Self-pay | Admitting: Anesthesiology

## 2015-12-07 ENCOUNTER — Ambulatory Visit: Payer: Medicare Other | Attending: Anesthesiology | Admitting: Anesthesiology

## 2015-12-07 VITALS — BP 165/137 | HR 93 | Temp 97.6°F | Resp 16 | Ht 60.0 in | Wt 186.0 lb

## 2015-12-07 DIAGNOSIS — J449 Chronic obstructive pulmonary disease, unspecified: Secondary | ICD-10-CM | POA: Diagnosis not present

## 2015-12-07 DIAGNOSIS — Z853 Personal history of malignant neoplasm of breast: Secondary | ICD-10-CM | POA: Insufficient documentation

## 2015-12-07 DIAGNOSIS — M5430 Sciatica, unspecified side: Secondary | ICD-10-CM | POA: Insufficient documentation

## 2015-12-07 DIAGNOSIS — Z87891 Personal history of nicotine dependence: Secondary | ICD-10-CM | POA: Insufficient documentation

## 2015-12-07 DIAGNOSIS — Z90710 Acquired absence of both cervix and uterus: Secondary | ICD-10-CM | POA: Insufficient documentation

## 2015-12-07 DIAGNOSIS — M5442 Lumbago with sciatica, left side: Secondary | ICD-10-CM | POA: Insufficient documentation

## 2015-12-07 DIAGNOSIS — H269 Unspecified cataract: Secondary | ICD-10-CM | POA: Insufficient documentation

## 2015-12-07 DIAGNOSIS — M47817 Spondylosis without myelopathy or radiculopathy, lumbosacral region: Secondary | ICD-10-CM

## 2015-12-07 DIAGNOSIS — K589 Irritable bowel syndrome without diarrhea: Secondary | ICD-10-CM | POA: Diagnosis not present

## 2015-12-07 DIAGNOSIS — I252 Old myocardial infarction: Secondary | ICD-10-CM | POA: Insufficient documentation

## 2015-12-07 DIAGNOSIS — I509 Heart failure, unspecified: Secondary | ICD-10-CM | POA: Insufficient documentation

## 2015-12-07 DIAGNOSIS — Z9049 Acquired absence of other specified parts of digestive tract: Secondary | ICD-10-CM | POA: Insufficient documentation

## 2015-12-07 DIAGNOSIS — E079 Disorder of thyroid, unspecified: Secondary | ICD-10-CM | POA: Insufficient documentation

## 2015-12-07 DIAGNOSIS — I10 Essential (primary) hypertension: Secondary | ICD-10-CM | POA: Diagnosis not present

## 2015-12-07 DIAGNOSIS — M5432 Sciatica, left side: Secondary | ICD-10-CM

## 2015-12-07 DIAGNOSIS — M5136 Other intervertebral disc degeneration, lumbar region: Secondary | ICD-10-CM | POA: Diagnosis not present

## 2015-12-07 DIAGNOSIS — M545 Low back pain: Secondary | ICD-10-CM | POA: Diagnosis present

## 2015-12-07 DIAGNOSIS — M25512 Pain in left shoulder: Secondary | ICD-10-CM | POA: Diagnosis present

## 2015-12-07 NOTE — Patient Instructions (Signed)
Pain Management Discharge Instructions  General Discharge Instructions :  If you need to reach your doctor call: Monday-Friday 8:00 am - 4:00 pm at [phone] or toll free [phone].  After clinic hours [phone] to have operator reach doctor.  Bring all of your medication bottles to all your appointments in the pain clinic.  To cancel or reschedule your appointment with Pain Management please remember to call 24 hours in advance to avoid a fee.  Refer to the educational materials which you have been given on: General Risks, I had my Procedure. Discharge Instructions, Post Sedation.  Post Procedure Instructions:  The drugs you were given will stay in your system until tomorrow, so for the next 24 hours you should not drive, make any legal decisions or drink any alcoholic beverages.  You may eat anything you prefer, but it is better to start with liquids then soups and crackers, and gradually work up to solid foods.  Please notify your doctor immediately if you have any unusual bleeding, trouble breathing or pain that is not related to your normal pain.  Depending on the type of procedure that was done, some parts of your body may feel week and/or numb.  This usually clears up by tonight or the next day.  Walk with the use of an assistive device or accompanied by an adult for the 24 hours.  You may use ice on the affected area for the first 24 hours.  Put ice in a Ziploc bag and cover with a towel and place against area 15 minutes on 15 minutes off.  You may switch to heat after 24 hours.GENERAL RISKS AND COMPLICATIONS  What are the risk, side effects and possible complications? Generally speaking, most procedures are safe.  However, with any procedure there are risks, side effects, and the possibility of complications.  The risks and complications are dependent upon the sites that are lesioned, or the type of nerve block to be performed.  The closer the procedure is to the spine,  the more serious the risks are.  Great care is taken when placing the radio frequency needles, block needles or lesioning probes, but sometimes complications can occur. 1. Infection: Any time there is an injection through the skin, there is a risk of infection.  This is why sterile conditions are used for these blocks.  There are four possible types of infection. 1. Localized skin infection. 2. Central Nervous System Infection-This can be in the form of Meningitis, which can be deadly. 3. Epidural Infections-This can be in the form of an epidural abscess, which can cause pressure inside of the spine, causing compression of the spinal cord with subsequent paralysis. This would require an emergency surgery to decompress, and there are no guarantees that the patient would recover from the paralysis. 4. Discitis-This is an infection of the intervertebral discs.  It occurs in about 1% of discography procedures.  It is difficult to treat and it may lead to surgery.        2. Pain: the needles have to go through skin and soft tissues, will cause soreness.       3. Damage to internal structures:  The nerves to be lesioned may be near blood vessels or    other nerves which can be potentially damaged.       4. Bleeding: Bleeding is more common if the patient is taking blood thinners such as  aspirin, Coumadin, Ticiid, Plavix, etc., or if he/she have some genetic predisposition  such as   hemophilia. Bleeding into the spinal canal can cause compression of the spinal  cord with subsequent paralysis.  This would require an emergency surgery to  decompress and there are no guarantees that the patient would recover from the  paralysis.       5. Pneumothorax:  Puncturing of a lung is a possibility, every time a needle is introduced in  the area of the chest or upper back.  Pneumothorax refers to free air around the  collapsed lung(s), inside of the thoracic cavity (chest cavity).  Another two possible  complications  related to a similar event would include: Hemothorax and Chylothorax.   These are variations of the Pneumothorax, where instead of air around the collapsed  lung(s), you may have blood or chyle, respectively.       6. Spinal headaches: They may occur with any procedures in the area of the spine.       7. Persistent CSF (Cerebro-Spinal Fluid) leakage: This is a rare problem, but may occur  with prolonged intrathecal or epidural catheters either due to the formation of a fistulous  track or a dural tear.       8. Nerve damage: By working so close to the spinal cord, there is always a possibility of  nerve damage, which could be as serious as a permanent spinal cord injury with  paralysis.       9. Death:  Although rare, severe deadly allergic reactions known as "Anaphylactic  reaction" can occur to any of the medications used.      10. Worsening of the symptoms:  We can always make thing worse.  What are the chances of something like this happening? Chances of any of this occuring are extremely low.  By statistics, you have more of a chance of getting killed in a motor vehicle accident: while driving to the hospital than any of the above occurring .  Nevertheless, you should be aware that they are possibilities.  In general, it is similar to taking a shower.  Everybody knows that you can slip, hit your head and get killed.  Does that mean that you should not shower again?  Nevertheless always keep in mind that statistics do not mean anything if you happen to be on the wrong side of them.  Even if a procedure has a 1 (one) in a 1,000,000 (million) chance of going wrong, it you happen to be that one..Also, keep in mind that by statistics, you have more of a chance of having something go wrong when taking medications.  Who should not have this procedure? If you are on a blood thinning medication (e.g. Coumadin, Plavix, see list of "Blood Thinners"), or if you have an active infection going on, you should not  have the procedure.  If you are taking any blood thinners, please inform your physician.  How should I prepare for this procedure?  Do not eat or drink anything at least six hours prior to the procedure.  Bring a driver with you .  It cannot be a taxi.  Come accompanied by an adult that can drive you back, and that is strong enough to help you if your legs get weak or numb from the local anesthetic.  Take all of your medicines the morning of the procedure with just enough water to swallow them.  If you have diabetes, make sure that you are scheduled to have your procedure done first thing in the morning, whenever possible.  If you have diabetes,   take only half of your insulin dose and notify our nurse that you have done so as soon as you arrive at the clinic.  If you are diabetic, but only take blood sugar pills (oral hypoglycemic), then do not take them on the morning of your procedure.  You may take them after you have had the procedure.  Do not take aspirin or any aspirin-containing medications, at least eleven (11) days prior to the procedure.  They may prolong bleeding.  Wear loose fitting clothing that may be easy to take off and that you would not mind if it got stained with Betadine or blood.  Do not wear any jewelry or perfume  Remove any nail coloring.  It will interfere with some of our monitoring equipment.  NOTE: Remember that this is not meant to be interpreted as a complete list of all possible complications.  Unforeseen problems may occur.  BLOOD THINNERS The following drugs contain aspirin or other products, which can cause increased bleeding during surgery and should not be taken for 2 weeks prior to and 1 week after surgery.  If you should need take something for relief of minor pain, you may take acetaminophen which is found in Tylenol,m Datril, Anacin-3 and Panadol. It is not blood thinner. The products listed below are.  Do not take any of the products listed below  in addition to any listed on your instruction sheet.  A.P.C or A.P.C with Codeine Codeine Phosphate Capsules #3 Ibuprofen Ridaura  ABC compound Congesprin Imuran rimadil  Advil Cope Indocin Robaxisal  Alka-Seltzer Effervescent Pain Reliever and Antacid Coricidin or Coricidin-D  Indomethacin Rufen  Alka-Seltzer plus Cold Medicine Cosprin Ketoprofen S-A-C Tablets  Anacin Analgesic Tablets or Capsules Coumadin Korlgesic Salflex  Anacin Extra Strength Analgesic tablets or capsules CP-2 Tablets Lanoril Salicylate  Anaprox Cuprimine Capsules Levenox Salocol  Anexsia-D Dalteparin Magan Salsalate  Anodynos Darvon compound Magnesium Salicylate Sine-off  Ansaid Dasin Capsules Magsal Sodium Salicylate  Anturane Depen Capsules Marnal Soma  APF Arthritis pain formula Dewitt's Pills Measurin Stanback  Argesic Dia-Gesic Meclofenamic Sulfinpyrazone  Arthritis Bayer Timed Release Aspirin Diclofenac Meclomen Sulindac  Arthritis pain formula Anacin Dicumarol Medipren Supac  Analgesic (Safety coated) Arthralgen Diffunasal Mefanamic Suprofen  Arthritis Strength Bufferin Dihydrocodeine Mepro Compound Suprol  Arthropan liquid Dopirydamole Methcarbomol with Aspirin Synalgos  ASA tablets/Enseals Disalcid Micrainin Tagament  Ascriptin Doan's Midol Talwin  Ascriptin A/D Dolene Mobidin Tanderil  Ascriptin Extra Strength Dolobid Moblgesic Ticlid  Ascriptin with Codeine Doloprin or Doloprin with Codeine Momentum Tolectin  Asperbuf Duoprin Mono-gesic Trendar  Aspergum Duradyne Motrin or Motrin IB Triminicin  Aspirin plain, buffered or enteric coated Durasal Myochrisine Trigesic  Aspirin Suppositories Easprin Nalfon Trillsate  Aspirin with Codeine Ecotrin Regular or Extra Strength Naprosyn Uracel  Atromid-S Efficin Naproxen Ursinus  Auranofin Capsules Elmiron Neocylate Vanquish  Axotal Emagrin Norgesic Verin  Azathioprine Empirin or Empirin with Codeine Normiflo Vitamin E  Azolid Emprazil Nuprin Voltaren  Bayer  Aspirin plain, buffered or children's or timed BC Tablets or powders Encaprin Orgaran Warfarin Sodium  Buff-a-Comp Enoxaparin Orudis Zorpin  Buff-a-Comp with Codeine Equegesic Os-Cal-Gesic   Buffaprin Excedrin plain, buffered or Extra Strength Oxalid   Bufferin Arthritis Strength Feldene Oxphenbutazone   Bufferin plain or Extra Strength Feldene Capsules Oxycodone with Aspirin   Bufferin with Codeine Fenoprofen Fenoprofen Pabalate or Pabalate-SF   Buffets II Flogesic Panagesic   Buffinol plain or Extra Strength Florinal or Florinal with Codeine Panwarfarin   Buf-Tabs Flurbiprofen Penicillamine   Butalbital Compound Four-way cold tablets   Penicillin   Butazolidin Fragmin Pepto-Bismol   Carbenicillin Geminisyn Percodan   Carna Arthritis Reliever Geopen Persantine   Carprofen Gold's salt Persistin   Chloramphenicol Goody's Phenylbutazone   Chloromycetin Haltrain Piroxlcam   Clmetidine heparin Plaquenil   Cllnoril Hyco-pap Ponstel   Clofibrate Hydroxy chloroquine Propoxyphen         Before stopping any of these medications, be sure to consult the physician who ordered them.  Some, such as Coumadin (Warfarin) are ordered to prevent or treat serious conditions such as "deep thrombosis", "pumonary embolisms", and other heart problems.  The amount of time that you may need off of the medication may also vary with the medication and the reason for which you were taking it.  If you are taking any of these medications, please make sure you notify your pain physician before you undergo any procedures.         Facet Joint Block, Care After Refer to this sheet in the next few weeks. These instructions provide you with information on caring for yourself after your procedure. Your health care provider may also give you more specific instructions. Your treatment has been planned according to current medical practices, but problems sometimes occur. Call your health care provider if you have any problems  or questions after your procedure. HOME CARE INSTRUCTIONS   Keep track of the amount of pain relief you feel and how long it lasts.  Limit pain medicine within the first 4-6 hours after the procedure as directed by your health care provider.  Resume taking dietary supplements and medicines as directed by your health care provider.  You may resume your regular diet.  Do not apply heat near or over the injection site(s) for 24 hours.   Do not take a bath or soak in water (such as a pool or lake) for 24 hours.  Do not drive for 24 hours unless approved by your health care provider.  Avoid strenuous activity for 24 hours.  Remove your bandages the morning after the procedure.   If the injection site is tender, applying an ice pack may relieve some tenderness. To do this:  Put ice in a bag.  Place a towel between your skin and the bag.  Leave the ice on for 15-20 minutes, 3-4 times a day.  Keep follow-up appointments as directed by your health care provider. SEEK MEDICAL CARE IF:   Your pain is not controlled by your medicines.   There is drainage from the injection site.   There is significant bleeding or swelling at the injection site.  You have diabetes and your blood sugar is above 180 mg/dL. SEEK IMMEDIATE MEDICAL CARE IF:   You develop a fever of 101F (38.3C) or greater.   You have worsening pain or swelling around the injection site.   You have red streaking around the injection site.   You develop severe pain that is not controlled by your medicines.   You develop a headache, stiff neck, nausea, or vomiting.   Your eyes become very sensitive to light.   You have weakness, paralysis, or tingling in your arms or legs that was not present before the procedure.   You develop difficulty urinating or breathing.    This information is not intended to replace advice given to you by your health care provider. Make sure you discuss any questions you  have with your health care provider.   Document Released: 03/04/2012 Document Revised: 04/08/2014 Document Reviewed: 03/04/2012 Elsevier Interactive Patient Education 2016   [employer] Facet Joint Block The facet joints connect the bones of the spine (vertebrae). They make it possible for you to bend, twist, and make other movements with your spine. They also prevent you from overbending, overtwisting, and making other excessive movements.  A facet joint block is a procedure where a numbing medicine (anesthetic) is injected into a facet joint. Often, a type of anti-inflammatory medicine called a steroid is also injected. A facet joint block may be done for two reasons:   Diagnosis. A facet joint block may be done as a test to see whether neck or back pain is caused by a worn-down or infected facet joint. If the pain gets better after a facet joint block, it means the pain is probably coming from the facet joint. If the pain does not get better, it means the pain is probably not coming from the facet joint.   Therapy. A facet joint block may be done to relieve neck or back pain caused by a facet joint. A facet joint block is only done as a therapy if the pain does not improve with medicine, exercise programs, physical therapy, and other forms of pain management. LET YOUR HEALTH CARE PROVIDER KNOW ABOUT:   Any allergies you have.   All medicines you are taking, including vitamins, herbs, eyedrops, and over-the-counter medicines and creams.   Previous problems you or members of your family have had with the use of anesthetics.   Any blood disorders you have had.   Other health problems you have. RISKS AND COMPLICATIONS Generally, having a facet joint block is safe. However, as with any procedure, complications can occur. Possible complications associated with having a facet joint block include:   Bleeding.   Injury to a nerve near the injection site.   Pain at the injection site.    Weakness or numbness in areas controlled by nerves near the injection site.   Infection.   Temporary fluid retention.   Allergic reaction to anesthetics or medicines used during the procedure. BEFORE THE PROCEDURE   Follow your health care provider's instructions if you are taking dietary supplements or medicines. You may need to stop taking them or reduce your dosage.   Do not take any new dietary supplements or medicines without asking your health care provider first.   Follow your health care provider's instructions about eating and drinking before the procedure. You may need to stop eating and drinking several hours before the procedure.   Arrange to have an adult drive you home after the procedure. PROCEDURE  You may need to remove your clothing and dress in an open-back gown so that your health care provider can access your spine.   The procedure will be done while you are lying on an X-ray table. Most of the time you will be asked to lie on your stomach, but you may be asked to lie in a different position if an injection will be made in your neck.   Special machines will be used to monitor your oxygen levels, heart rate, and blood pressure.   If an injection will be made in your neck, an intravenous (IV) tube will be inserted into one of your veins. Fluids and medicine will flow directly into your body through the IV tube.   The area over the facet joint where the injection will be made will be cleaned with an antiseptic soap. The surrounding skin will be covered with sterile drapes.   An anesthetic will be applied to   your skin to make the injection area numb. You may feel a temporary stinging or burning sensation.   A video X-ray machine will be used to locate the joint. A contrast dye may be injected into the facet joint area to help with locating the joint.   When the joint is located, an anesthetic medicine will be injected into the joint through the  needle.   Your health care provider will ask you whether you feel pain relief. If you do feel relief, a steroid may be injected to provide pain relief for a longer period of time. If you do not feel relief or feel only partial relief, additional injections of an anesthetic may be made in other facet joints.   The needle will be removed, the skin will be cleansed, and bandages will be applied.  AFTER THE PROCEDURE   You will be observed for 15-30 minutes before being allowed to go home. Do not drive. Have an adult drive you or take a taxi or public transportation instead.   If you feel pain relief, the pain will return in several hours or days when the anesthetic wears off.   You may feel pain relief 2-14 days after the procedure. The amount of time this relief lasts varies from person to person.   It is normal to feel some tenderness over the injected area(s) for 2 days following the procedure.   If you have diabetes, you may have a temporary increase in blood sugar.   This information is not intended to replace advice given to you by your health care provider. Make sure you discuss any questions you have with your health care provider.   Document Released: 08/07/2006 Document Revised: 04/08/2014 Document Reviewed: 01/06/2012 Elsevier Interactive Patient Education 2016 [employer]  

## 2015-12-07 NOTE — Progress Notes (Signed)
Patient here for evaluation d/t lower back pain.   Safety precautions to be maintained throughout the outpatient stay will include: orient to surroundings, keep bed in low position, maintain call bell within reach at all times, provide assistance with transfer out of bed and ambulation.

## 2015-12-11 NOTE — Progress Notes (Signed)
Subjective:  Patient ID: Deborah Poole, female    DOB: Aug 02, 1935  Age: 80 y.o. MRN: [id number]  CC: Back Pain (lower) and Shoulder Pain (left)  Procedure: L5-S1 epidural   under fluoroscopic guidance and moderate sedation  Previous procedure: Series of 3 epidural steroids ending in August  HPI Deborah Poole presents for reevaluation. She was last seen a month ago and continues to do well in regards to her lower extremity pain. It's approximately 75% better and she still having some low back pain. She desires to proceed with a repeat epidural today. The quality characteristic and description of pain are otherwise unchanged regarding her strength and sensory function. Her bowel bladder function has also been stable.  She reports that she was doing very well following her last injection but has had some recurrence of the lower extremity pain and back pain because she is having to take care of her incapacitated husband and this is been quite challenging. She is requesting a repeat epidural injection to see if this can further reduce some of the recurrent pain in the meantime she is hoping that they can acquire some left help for her in the future. Otherwise the quality of the pain has been stable in nature.   History Deborah Poole has a past medical history of Breast cancer ([place]); Cataract; CHF (congestive heart failure) ([place]); COPD (chronic obstructive pulmonary disease) ([place]); Heart attack ([place]); Hypertension; Irritable bowel syndrome (IBS); and Thyroid disease.   She has a past surgical history that includes Cholecystectomy; Breast surgery; Cardiac surgery; Eye surgery; STENT PLACEMENT VASCULAR ([place] [facility]); Rotator cuff repair; and Abdominal hysterectomy.   Her family history includes Hypertension in her other.She reports that she has quit smoking. She has never used smokeless tobacco. She reports that she drinks alcohol. She reports that she does not use  drugs.   ---------------------------------------------------------------------------------------------------------------------- Past Medical History:  Diagnosis Date  . Breast cancer ([place])   . Cataract   . CHF (congestive heart failure) ([place])   . COPD (chronic obstructive pulmonary disease) ([place])   . Heart attack ([place])   . Hypertension   . Irritable bowel syndrome (IBS)   . Thyroid disease     Past Surgical History:  Procedure Laterality Date  . ABDOMINAL HYSTERECTOMY    . BREAST SURGERY    . CARDIAC SURGERY    . CHOLECYSTECTOMY    . EYE SURGERY    . ROTATOR CUFF REPAIR    . STENT PLACEMENT VASCULAR ([facility])      Family History  Problem Relation Age of Onset  . Hypertension Other     Social History  Substance Use Topics  . Smoking status: Former Research scientist (life sciences)  . Smokeless tobacco: Never Used  . Alcohol use 0.0 oz/week     Comment: once a month or so     ---------------------------------------------------------------------------------------------------------------------- Social History   Social History  . Marital status: Married    Spouse name: N/A  . Number of children: N/A  . Years of education: N/A   Social History Main Topics  . Smoking status: Former Research scientist (life sciences)  . Smokeless tobacco: Never Used  . Alcohol use 0.0 oz/week     Comment: once a month or so   . Drug use: No  . Sexual activity: Not Asked   Other Topics Concern  . None   Social History Narrative  . None      ----------------------------------------------------------------------------------------------------------------------  ROS Review of Systems No changes are reported   Objective:  BP (!) 165/137  Pulse 93   Temp 97.6 F (36.4 C) (Oral)   Resp 16   Ht 5' (1.524 m)   Wt 186 lb (84.4 kg)   SpO2 96%   BMI 36.33 kg/m   Physical Exam  Patient is alert and oriented cooperative and compliant Pupils are equally round reactive light EOMI Heart is regular rate and rhythm without  murmur Lungs are clear  Inspection low back reveals some mild paraspinous muscle tenderness but no overt trigger points. She still has a slightly antalgic gait with a positive straight leg raise on examination and furthermore she does have pain on extension at the low back in the standing position with right and left lateral rotation.   Assessment & Plan:   Deborah Poole was seen today for back pain and shoulder pain.  Diagnoses and all orders for this visit:  DDD (degenerative disc disease), lumbar  Sciatica associated with disorder of lumbar spine, left  Facet arthritis of lumbosacral region -     LUMBAR FACET(MEDIAL BRANCH NERVE BLOCK) MBNB; Future -     LUMBAR FACET(MEDIAL BRANCH NERVE BLOCK) MBNB; Future     ----------------------------------------------------------------------------------------------------------------------  Problem List Items Addressed This Visit    None    Visit Diagnoses    DDD (degenerative disc disease), lumbar    -  Primary   Sciatica associated with disorder of lumbar spine, left       Facet arthritis of lumbosacral region       Relevant Orders   LUMBAR FACET(MEDIAL BRANCH NERVE BLOCK) MBNB   LUMBAR FACET(MEDIAL BRANCH NERVE BLOCK) MBNB      ----------------------------------------------------------------------------------------------------------------------  1. DDD (degenerative disc disease), lumbar We'll plan on A repeat epidural steroid injection today. The risks and benefits been reviewed with her in full detail and all questions answered. We'll attempt to do this at the L 5 S1 interspace and we will have her return to clinic in 2 month for reevaluation ... If she still having pain at that time we may consider a facet injection for the bilateral back pain that she experiences on extension - LUMBAR EPIDURAL STEROID INJECTION -  - triamcinolone acetonide (KENALOG-40) injection 40 mg; 1 mL (40 mg total) by Other route once. - sodium chloride flush  (NS) 0.9 % injection 10 mL; 10 mLs by Other route once. - ropivacaine (PF) 2 mg/ml (0.2%) (NAROPIN) epidural 10 mL; 10 mLs by Epidural route once. - lidocaine (PF) (XYLOCAINE) 1 % injection 5 mL; Inject 5 mLs into the skin once.  2. Sciatica associated with disorder of lumbar spine, left Same as 1. She also may be a candidate for a bilateral facet block. Is more we have discussed options regarding physical therapy with efforts at weight loss and or strengthening - LUMBAR EPIDURAL STEROID INJECTION - - triamcinolone acetonide (KENALOG-40) injection 40 mg; 1 mL (40 mg total) by Other route once. - sodium chloride flush (NS) 0.9 % injection 10 mL; 10 mLs by Other route once. - ropivacaine (PF) 2 mg/ml (0.2%) (NAROPIN) epidural 10 mL; 10 mLs by Epidural route once. - lidocaine (PF) (XYLOCAINE) 1 % injection 5 mL; Inject 5 mLs into the skin once.    ----------------------------------------------------------------------------------------------------------------------  I am having Deborah Poole maintain her carvedilol, omeprazole, simvastatin, spironolactone, ALPRAZolam, cholecalciferol, Magnesium, NIFEdipine, oyster calcium, albuterol, oxycodone-acetaminophen, VOLTAREN, HYDROcodone-acetaminophen, oxyCODONE-acetaminophen, fluticasone, Biotin, Melatonin, and ibuprofen. We will continue to administer triamcinolone acetonide, sodium chloride flush, ropivacaine (PF) 2 mg/ml (0.2%), lidocaine (PF), triamcinolone acetonide, sodium chloride flush, ropivacaine (PF) 2 mg/ml (0.2%), midazolam, lidocaine (  PF), lactated ringers, and iopamidol.   No orders of the defined types were placed in this encounter.   Procedure:  Procedure: L5 S1 LESI  with fluoroscopic guidance and without moderate sedation.... We are proceeding with a fourth epidural secondary to the recent recurrent injury she experienced to see if this can get her pain back under good control.  NOTE: The risks, benefits, and expectations of the  procedure have been discussed and explained to the patient who was understanding and in agreement with suggested treatment plan. No guarantees were made.  DESCRIPTION OF PROCEDURE: Lumbar epidural steroid injection with, EKG, blood pressure, pulse, and pulse oximetry monitoring. The procedure was performed with the patient in the prone position under fluoroscopic guidance. A local anesthetic skin wheal of 1.5% plain lidocaine was performed at the appropriate site after fluoroscopic identifictation  Using strict aseptic technique, I then advanced an 18-gauge Tuohy epidural needle in the midline via loss-of-resistance to saline. There was negative aspiration for heme or  CSF.  I then confirmed position with both AP and Lateral fluoroscan. 2 cc of Isovue was injected getting good epidural spread and at L 5 S1  A total of 5 mL of Preservative-Free normal saline with 40 mg of Kenalog and 1cc Ropicaine 0.2 percent was injected incrementally via the  epidurally placed needle. Needle removed. The patient tolerated the injection well and was convalesced and discharged to home in stable condition. If the patient has any post procedure difficulty they have been instructed on how to contact us for assistance.     Molli Barrows, MD

## 2016-01-01 ENCOUNTER — Telehealth: Payer: Self-pay | Admitting: *Deleted

## 2016-01-02 ENCOUNTER — Telehealth: Payer: Self-pay | Admitting: *Deleted

## 2016-01-02 NOTE — Telephone Encounter (Signed)
Attempted to call patient, voice mailbox has not been set up. 

## 2016-01-03 NOTE — Telephone Encounter (Signed)
patient called and put on scheduled for a procedure for 01-09-2016 at 1pm. Patient given detailed pre procedure instructions and appointment confirmed with read back.

## 2016-01-09 ENCOUNTER — Ambulatory Visit (HOSPITAL_BASED_OUTPATIENT_CLINIC_OR_DEPARTMENT_OTHER): Payer: Medicare Other | Admitting: Anesthesiology

## 2016-01-09 ENCOUNTER — Other Ambulatory Visit: Payer: Self-pay | Admitting: Anesthesiology

## 2016-01-09 ENCOUNTER — Encounter: Payer: Self-pay | Admitting: Anesthesiology

## 2016-01-09 ENCOUNTER — Ambulatory Visit
Admission: RE | Admit: 2016-01-09 | Discharge: 2016-01-09 | Disposition: A | Discharge status: Home / Self Care | Payer: Medicare Other | Source: Ambulatory Visit | Attending: Anesthesiology | Admitting: Anesthesiology

## 2016-01-09 VITALS — BP 164/80 | HR 70 | Temp 98.7°F | Resp 16 | Ht 62.0 in | Wt 192.0 lb

## 2016-01-09 DIAGNOSIS — M5136 Other intervertebral disc degeneration, lumbar region: Secondary | ICD-10-CM

## 2016-01-09 DIAGNOSIS — R52 Pain, unspecified: Secondary | ICD-10-CM

## 2016-01-09 DIAGNOSIS — M4697 Unspecified inflammatory spondylopathy, lumbosacral region: Secondary | ICD-10-CM | POA: Insufficient documentation

## 2016-01-09 DIAGNOSIS — M5386 Other specified dorsopathies, lumbar region: Secondary | ICD-10-CM

## 2016-01-09 DIAGNOSIS — M539 Dorsopathy, unspecified: Secondary | ICD-10-CM | POA: Diagnosis not present

## 2016-01-09 DIAGNOSIS — M5432 Sciatica, left side: Secondary | ICD-10-CM | POA: Insufficient documentation

## 2016-01-09 DIAGNOSIS — M47817 Spondylosis without myelopathy or radiculopathy, lumbosacral region: Secondary | ICD-10-CM

## 2016-01-09 MED ORDER — SODIUM CHLORIDE 0.9% FLUSH: 10.0000 mL | Freq: Once | INTRAVENOUS | Status: AC

## 2016-01-09 MED ORDER — TRIAMCINOLONE ACETONIDE 40 MG/ML IJ SUSP: 40.0000 mg | Freq: Once | INTRAMUSCULAR | Status: AC

## 2016-01-09 MED ORDER — LIDOCAINE HCL (PF) 1 % IJ SOLN: 5.0000 mL | Freq: Once | INTRAMUSCULAR | Status: AC

## 2016-01-09 MED ORDER — TRIAMCINOLONE ACETONIDE 40 MG/ML IJ SUSP
INTRAMUSCULAR | Status: AC
  Administered 2016-01-09: 14:00:00
  Filled 2016-01-09: qty 1

## 2016-01-09 MED ORDER — ROPIVACAINE HCL 2 MG/ML IJ SOLN
INTRAMUSCULAR | Status: AC
  Administered 2016-01-09: 14:00:00
  Filled 2016-01-09: qty 10

## 2016-01-09 MED ORDER — ROPIVACAINE HCL 2 MG/ML IJ SOLN: 10.0000 mL | Freq: Once | INTRAMUSCULAR | Status: AC

## 2016-01-09 MED ORDER — MIDAZOLAM HCL 5 MG/5ML IJ SOLN: 5.0000 mg | Freq: Once | INTRAMUSCULAR | Status: AC

## 2016-01-09 MED ORDER — LACTATED RINGERS IV SOLN: 1000.0000 mL | INTRAVENOUS | Status: AC

## 2016-01-09 NOTE — Progress Notes (Signed)
Safety precautions to be maintained throughout the outpatient stay will include: orient to surroundings, keep bed in low position, maintain call bell within reach at all times, provide assistance with transfer out of bed and ambulation.  

## 2016-01-09 NOTE — Patient Instructions (Signed)
Pain Management Discharge Instructions  General Discharge Instructions :  If you need to reach your doctor call: Monday-Friday 8:00 am - 4:00 pm at [phone] or toll free [phone].  After clinic hours [phone] to have operator reach doctor.  Bring all of your medication bottles to all your appointments in the pain clinic.  To cancel or reschedule your appointment with Pain Management please remember to call 24 hours in advance to avoid a fee.  Refer to the educational materials which you have been given on: General Risks, I had my Procedure. Discharge Instructions, Post Sedation.  Post Procedure Instructions:  The drugs you were given will stay in your system until tomorrow, so for the next 24 hours you should not drive, make any legal decisions or drink any alcoholic beverages.  You may eat anything you prefer, but it is better to start with liquids then soups and crackers, and gradually work up to solid foods.  Please notify your doctor immediately if you have any unusual bleeding, trouble breathing or pain that is not related to your normal pain.  Depending on the type of procedure that was done, some parts of your body may feel week and/or numb.  This usually clears up by tonight or the next day.  Walk with the use of an assistive device or accompanied by an adult for the 24 hours.  You may use ice on the affected area for the first 24 hours.  Put ice in a Ziploc bag and cover with a towel and place against area 15 minutes on 15 minutes off.  You may switch to heat after 24 hours.Facet Joint Block The facet joints connect the bones of the spine (vertebrae). They make it possible for you to bend, twist, and make other movements with your spine. They also prevent you from overbending, overtwisting, and making other excessive movements.  A facet joint block is a procedure where a numbing medicine (anesthetic) is injected into a facet joint. Often, a type of anti-inflammatory  medicine called a steroid is also injected. A facet joint block may be done for two reasons:   Diagnosis. A facet joint block may be done as a test to see whether neck or back pain is caused by a worn-down or infected facet joint. If the pain gets better after a facet joint block, it means the pain is probably coming from the facet joint. If the pain does not get better, it means the pain is probably not coming from the facet joint.   Therapy. A facet joint block may be done to relieve neck or back pain caused by a facet joint. A facet joint block is only done as a therapy if the pain does not improve with medicine, exercise programs, physical therapy, and other forms of pain management. LET [facility] CARE PROVIDER KNOW ABOUT:   Any allergies you have.   All medicines you are taking, including vitamins, herbs, eyedrops, and over-the-counter medicines and creams.   Previous problems you or members of your family have had with the use of anesthetics.   Any blood disorders you have had.   Other health problems you have. RISKS AND COMPLICATIONS Generally, having a facet joint block is safe. However, as with any procedure, complications can occur. Possible complications associated with having a facet joint block include:   Bleeding.   Injury to a nerve near the injection site.   Pain at the injection site.   Weakness or numbness in areas controlled by nerves near  the injection site.   Infection.   Temporary fluid retention.   Allergic reaction to anesthetics or medicines used during the procedure. BEFORE THE PROCEDURE   Follow your health care provider's instructions if you are taking dietary supplements or medicines. You may need to stop taking them or reduce your dosage.   Do not take any new dietary supplements or medicines without asking your health care provider first.   Follow your health care provider's instructions about eating and drinking before the  procedure. You may need to stop eating and drinking several hours before the procedure.   Arrange to have an adult drive you home after the procedure. PROCEDURE  You may need to remove your clothing and dress in an open-back gown so that your health care provider can access your spine.   The procedure will be done while you are lying on an X-ray table. Most of the time you will be asked to lie on your stomach, but you may be asked to lie in a different position if an injection will be made in your neck.   Special machines will be used to monitor your oxygen levels, heart rate, and blood pressure.   If an injection will be made in your neck, an intravenous (IV) tube will be inserted into one of your veins. Fluids and medicine will flow directly into your body through the IV tube.   The area over the facet joint where the injection will be made will be cleaned with an antiseptic soap. The surrounding skin will be covered with sterile drapes.   An anesthetic will be applied to your skin to make the injection area numb. You may feel a temporary stinging or burning sensation.   A video X-ray machine will be used to locate the joint. A contrast dye may be injected into the facet joint area to help with locating the joint.   When the joint is located, an anesthetic medicine will be injected into the joint through the needle.   Your health care provider will ask you whether you feel pain relief. If you do feel relief, a steroid may be injected to provide pain relief for a longer period of time. If you do not feel relief or feel only partial relief, additional injections of an anesthetic may be made in other facet joints.   The needle will be removed, the skin will be cleansed, and bandages will be applied.  AFTER THE PROCEDURE   You will be observed for 15-30 minutes before being allowed to go home. Do not drive. Have an adult drive you or take a taxi or public transportation instead.    If you feel pain relief, the pain will return in several hours or days when the anesthetic wears off.   You may feel pain relief 2-14 days after the procedure. The amount of time this relief lasts varies from person to person.   It is normal to feel some tenderness over the injected area(s) for 2 days following the procedure.   If you have diabetes, you may have a temporary increase in blood sugar.   This information is not intended to replace advice given to you by your health care provider. Make sure you discuss any questions you have with your health care provider.   Document Released: 08/07/2006 Document Revised: 04/08/2014 Document Reviewed: 01/06/2012 [employer] Interactive Patient Education 2016 [employer]  NO FLU SHOTS TWO WEEKS BEFORE OR 2 WEEKS AFTER PROCEDURE!!!!

## 2016-01-10 ENCOUNTER — Telehealth: Payer: Self-pay | Admitting: *Deleted

## 2016-01-10 NOTE — Progress Notes (Signed)
Subjective:  Patient ID: Deborah Poole, female    DOB: May 13, 1935  Age: 80 y.o. MRN: [id number]  CC: Back Pain (low and worse on the right side- down backs of both legs)  Procedure:Right side diagnostic medial branch block to the facet injection at L34 45 L5-S1 and S1 under fluoroscopic guidance with moderate sedation  Previous procedure: Series of 3 epidural steroids ending in August  HPI Deborah Poole presents for reevaluation. She was last seen a month ago and continues to do well in regards to her lower extremity pain. She has had some significant improvement in her lower extremity pain but continues to have severe right side lower back pain with radiation into the buttock and posterior hip region. This pain is worse with prolonged standing and any activity especially rotation type events. Nothing much seems to help she states and she is having trouble getting any relief. She is tried medication management and conservative therapy with physical therapy activities without improvement. She presents today desired to proceed with a right side facet block as we have previously talked about. Otherwise no change in lower extremity strength or function is noted no problems with bowel or bladder function are noted  History [name] has a past medical history of Breast cancer ([place]); Cataract; CHF (congestive heart failure) ([place]); COPD (chronic obstructive pulmonary disease) ([place]); Heart attack; Hypertension; Irritable bowel syndrome (IBS); and Thyroid disease.   She has a past surgical history that includes Cholecystectomy; Breast surgery; Cardiac surgery; Eye surgery; STENT PLACEMENT VASCULAR ([place] [facility]); Rotator cuff repair; and Abdominal hysterectomy.   Her family history includes Hypertension in her other.She reports that she has quit smoking. She has never used smokeless tobacco. She reports that she drinks alcohol. She reports that she does not use  drugs.   ---------------------------------------------------------------------------------------------------------------------- Past Medical History:  Diagnosis Date  . Breast cancer ([place])   . Cataract   . CHF (congestive heart failure) ([place])   . COPD (chronic obstructive pulmonary disease) ([place])   . Heart attack   . Hypertension   . Irritable bowel syndrome (IBS)   . Thyroid disease     Past Surgical History:  Procedure Laterality Date  . ABDOMINAL HYSTERECTOMY    . BREAST SURGERY    . CARDIAC SURGERY    . CHOLECYSTECTOMY    . EYE SURGERY    . ROTATOR CUFF REPAIR    . STENT PLACEMENT VASCULAR ([facility])      Family History  Problem Relation Age of Onset  . Hypertension Other     Social History  Substance Use Topics  . Smoking status: Former Research scientist (life sciences)  . Smokeless tobacco: Never Used  . Alcohol use 0.0 oz/week     Comment: once a month or so     ---------------------------------------------------------------------------------------------------------------------- Social History   Social History  . Marital status: Married    Spouse name: N/A  . Number of children: N/A  . Years of education: N/A   Social History Main Topics  . Smoking status: Former Research scientist (life sciences)  . Smokeless tobacco: Never Used  . Alcohol use 0.0 oz/week     Comment: once a month or so   . Drug use: No  . Sexual activity: Not Asked   Other Topics Concern  . None   Social History Narrative  . None      ----------------------------------------------------------------------------------------------------------------------  ROS Review of Systems No changes are reported   Objective:  BP (!) 164/80   Pulse 70   Temp 98.7 F (37.1 C) (Oral)  Resp 16   Ht 5\' 2"  (1.575 m)   Wt 192 lb (87.1 kg)   SpO2 96%   BMI 35.12 kg/m   Physical Exam  Patient is alert and oriented cooperative and compliant Pupils are equally round reactive light EOMI Heart is regular rate and rhythm without  murmur Lungs are clear  Inspection low back reveals some mild paraspinous muscle tenderness but no overt trigger points. She still has a slightly antalgic gaitWith pain on extension in the standing position with right lateral rotation producing more pain radiating into the buttock and hip region than on the left side..   Assessment & Plan:   [name] was seen today for back pain.  Diagnoses and all orders for this visit:  DDD (degenerative disc disease), lumbar -     Cancel: LUMBAR EPIDURAL STEROID INJECTION -     Cancel: LUMBAR EPIDURAL STEROID INJECTION  Facet arthritis of lumbosacral region ([place]) -     Cancel: LUMBAR FACET(MEDIAL BRANCH NERVE BLOCK) MBNB -     Cancel: LUMBAR FACET(MEDIAL BRANCH NERVE BLOCK) MBNB -     triamcinolone acetonide (KENALOG-40) injection 40 mg; 1 mL (40 mg total) by Other route once. -     sodium chloride flush (NS) 0.9 % injection 10 mL; 10 mLs by Other route once. -     ropivacaine (PF) 2 mg/ml (0.2%) (NAROPIN) epidural 10 mL; 10 mLs by Epidural route once. -     midazolam (VERSED) 5 MG/5ML injection 5 mg; Inject 5 mLs (5 mg total) into the vein once. -     lidocaine (PF) (XYLOCAINE) 1 % injection 5 mL; Inject 5 mLs into the skin once. -     lactated ringers infusion 1,000 mL; Inject 1,000 mLs into the vein continuous. -     LUMBAR FACET(MEDIAL BRANCH NERVE BLOCK) MBNB; Future  Sciatica of left side associated with disorder of lumbar spine -     Cancel: LUMBAR EPIDURAL STEROID INJECTION -     Cancel: LUMBAR EPIDURAL STEROID INJECTION  Other orders -     ropivacaine (PF) 2 mg/ml (0.2%) (NAROPIN) 2 MG/ML epidural;  -     triamcinolone acetonide (KENALOG-40) 40 MG/ML injection;      ----------------------------------------------------------------------------------------------------------------------  Problem List Items Addressed This Visit    None    Visit Diagnoses    DDD (degenerative disc disease), lumbar    -  Primary   Relevant Medications    triamcinolone acetonide (KENALOG-40) injection 40 mg   triamcinolone acetonide (KENALOG-40) 40 MG/ML injection (Completed)   Facet arthritis of lumbosacral region ([place])       Relevant Medications   triamcinolone acetonide (KENALOG-40) injection 40 mg   sodium chloride flush (NS) 0.9 % injection 10 mL   ropivacaine (PF) 2 mg/ml (0.2%) (NAROPIN) epidural 10 mL   midazolam (VERSED) 5 MG/5ML injection 5 mg   lidocaine (PF) (XYLOCAINE) 1 % injection 5 mL   lactated ringers infusion 1,000 mL   triamcinolone acetonide (KENALOG-40) 40 MG/ML injection (Completed)   Other Relevant Orders   LUMBAR FACET(MEDIAL BRANCH NERVE BLOCK) MBNB   Sciatica of left side associated with disorder of lumbar spine       Relevant Medications   midazolam (VERSED) 5 MG/5ML injection 5 mg      ---------------------------------------------------------------------------------------------------------------------- Assessment plan. 1. Right side facet arthropathy. We will proceed with a diagnostic right side facet block as described to the patient today. We talked up the risks and benefits of the procedure and she fully understands these.  I want her to continue with stretching strengthening and core activity as best she can. She is to return to clinic in 1 month for reevaluation possible repeat injection at that time. 2. Degenerative disc disease with history of right side greater than left sciatica. We'll defer on repeat epidural steroid injection at this point. Continue with stretching exercises as above. Continue with her current medication regimen.   ----------------------------------------------------------------------------------------------------------------------  I am having Ms. Petion maintain her carvedilol, omeprazole, simvastatin, spironolactone, ALPRAZolam, cholecalciferol, Magnesium, NIFEdipine, oyster calcium, albuterol, oxycodone-acetaminophen, VOLTAREN, HYDROcodone-acetaminophen, oxyCODONE-acetaminophen,  fluticasone, Biotin, Melatonin, and ibuprofen. We administered ropivacaine (PF) 2 mg/ml (0.2%) and triamcinolone acetonide. We will continue to administer triamcinolone acetonide, sodium chloride flush, ropivacaine (PF) 2 mg/ml (0.2%), lidocaine (PF), triamcinolone acetonide, sodium chloride flush, ropivacaine (PF) 2 mg/ml (0.2%), midazolam, lidocaine (PF), lactated ringers, iopamidol, triamcinolone acetonide, sodium chloride flush, ropivacaine (PF) 2 mg/ml (0.2%), midazolam, lidocaine (PF), and lactated ringers.   Meds ordered this encounter  Medications  . triamcinolone acetonide (KENALOG-40) injection 40 mg  . sodium chloride flush (NS) 0.9 % injection 10 mL  . ropivacaine (PF) 2 mg/ml (0.2%) (NAROPIN) epidural 10 mL  . midazolam (VERSED) 5 MG/5ML injection 5 mg  . lidocaine (PF) (XYLOCAINE) 1 % injection 5 mL  . lactated ringers infusion 1,000 mL  . ropivacaine (PF) 2 mg/ml (0.2%) (NAROPIN) 2 MG/ML epidural    Willeen Cass L: cabinet override  . triamcinolone acetonide (KENALOG-40) 40 MG/ML injection    Willeen Cass L: cabinet override    Procedure:  Right side facet block under fluoroscopic guidance at L3-4 L4-5 L5-S1 and S1 under moderate sedation.  Patient was taken to the fluoroscopy suite and placed in prone position. A total dose of 0 mg of Versed with 0 cc of fentanyl were titrated for moderate sedation. Vital signs were stable throughout the procedure. The  overlying area of skin on the  right side was prepped with Betadine 3 and strict aseptic technique was utilized throughout the procedure. Flouroscopy was used to I identify the areas overlying the aforementioned facets at L3-4  L4-5 and  L5-S1. 1% lidocaine 1 cc was infiltrated subcutaneously and into the fascia with a 25-gauge needle at each of these sites. I then advanced a 22-gauge 3-1/2 inch Quinckie needle with the needle tip to lie at the "[facility]" portion of the [employer] dog". There was negative aspiration for  heme or CSF and  no paresthesia. I then injected 2 cc of ropivacaine 0.2% mixed with10 mg of triamcinolone at each of the aforementioned sites. These needles were withdrawn and the L5-S1 needle was redirected towards the S1 posterior foramen. This was done without  paresthesia, there was negative aspiration and 2 cc of this same mixture was injected at this site. The patient was convalesced discharged home stable condition for follow-up as mentioned.     Molli Barrows, MD

## 2016-01-10 NOTE — Telephone Encounter (Signed)
pateint denies problems post procedure.

## 2016-01-23 ENCOUNTER — Telehealth: Payer: Self-pay | Admitting: *Deleted

## 2016-01-23 DIAGNOSIS — I119 Hypertensive heart disease without heart failure: Secondary | ICD-10-CM | POA: Diagnosis not present

## 2016-01-23 DIAGNOSIS — I2510 Atherosclerotic heart disease of native coronary artery without angina pectoris: Secondary | ICD-10-CM | POA: Diagnosis not present

## 2016-01-23 DIAGNOSIS — M5432 Sciatica, left side: Secondary | ICD-10-CM | POA: Diagnosis not present

## 2016-01-23 DIAGNOSIS — J449 Chronic obstructive pulmonary disease, unspecified: Secondary | ICD-10-CM | POA: Diagnosis not present

## 2016-01-23 NOTE — Telephone Encounter (Signed)
Spoke with patient about her pain, states the last procedure did not work at all for her.  States that she will be out of pain medicine by next week and doesn't think she can make it till next procedure appt time.  Patient is currently getting pain medication from her PCP but states they will not give her anymore.  States she is having to take care of her husband who is full-time care d/t dementia and is causing more strain on her back.  Patient would like to be seen sooner. Will Speak to Dr Andree Elk on tomorrow when in clinic and respond back to her afterwards.

## 2016-01-24 NOTE — Telephone Encounter (Signed)
Spoke with patient this morning to let her know that we do not have an earlier appt time and that we would most likely be able to prescribe for her on procedure day and if possible she should ask her primary care physician to cover her meds until then which is within the realm of her contract with DR Andree Elk.  Patient verbalizes u/o information and states that she was seen on yesterday by her PCP and prescribed Voltaren tabs and is going to try these to see if they help her back pain.  We will see patient on November 16 for procedure and discussion of medication management.

## 2016-02-15 ENCOUNTER — Encounter: Payer: Self-pay | Admitting: Anesthesiology

## 2016-02-15 ENCOUNTER — Ambulatory Visit: Payer: Medicare Other | Attending: Anesthesiology | Admitting: Anesthesiology

## 2016-02-15 VITALS — BP 175/81 | HR 68 | Temp 97.8°F | Resp 18 | Ht 60.0 in | Wt 186.0 lb

## 2016-02-15 DIAGNOSIS — Z90710 Acquired absence of both cervix and uterus: Secondary | ICD-10-CM | POA: Diagnosis not present

## 2016-02-15 DIAGNOSIS — M5442 Lumbago with sciatica, left side: Secondary | ICD-10-CM | POA: Diagnosis not present

## 2016-02-15 DIAGNOSIS — M5136 Other intervertebral disc degeneration, lumbar region: Secondary | ICD-10-CM

## 2016-02-15 DIAGNOSIS — Z5189 Encounter for other specified aftercare: Secondary | ICD-10-CM | POA: Diagnosis not present

## 2016-02-15 DIAGNOSIS — Z87891 Personal history of nicotine dependence: Secondary | ICD-10-CM | POA: Diagnosis not present

## 2016-02-15 DIAGNOSIS — M47817 Spondylosis without myelopathy or radiculopathy, lumbosacral region: Secondary | ICD-10-CM

## 2016-02-15 DIAGNOSIS — Z9049 Acquired absence of other specified parts of digestive tract: Secondary | ICD-10-CM | POA: Insufficient documentation

## 2016-02-15 DIAGNOSIS — Z9889 Other specified postprocedural states: Secondary | ICD-10-CM | POA: Insufficient documentation

## 2016-02-15 DIAGNOSIS — M5386 Other specified dorsopathies, lumbar region: Secondary | ICD-10-CM

## 2016-02-15 DIAGNOSIS — M539 Dorsopathy, unspecified: Secondary | ICD-10-CM

## 2016-02-15 DIAGNOSIS — M4697 Unspecified inflammatory spondylopathy, lumbosacral region: Secondary | ICD-10-CM

## 2016-02-15 MED ORDER — SODIUM CHLORIDE 0.9% FLUSH: 10.0000 mL | Freq: Once | INTRAVENOUS | Status: AC

## 2016-02-15 MED ORDER — TRIAMCINOLONE ACETONIDE 40 MG/ML IJ SUSP
40.0000 mg | Freq: Once | INTRAMUSCULAR | Status: AC
  Administered 2016-02-15: 40 mg
  Filled 2016-02-15: qty 1

## 2016-02-15 MED ORDER — SODIUM CHLORIDE 0.9 % IJ SOLN
INTRAMUSCULAR | Status: AC
  Administered 2016-02-15: 15:00:00
  Filled 2016-02-15: qty 20

## 2016-02-15 MED ORDER — LIDOCAINE HCL (PF) 1 % IJ SOLN
5.0000 mL | Freq: Once | INTRAMUSCULAR | Status: AC
  Filled 2016-02-15: qty 5

## 2016-02-15 MED ORDER — IOPAMIDOL (ISOVUE-M 200) INJECTION 41%: 20.0000 mL | Freq: Once | INTRAMUSCULAR | Status: AC | PRN

## 2016-02-15 MED ORDER — ROPIVACAINE HCL 2 MG/ML IJ SOLN
10.0000 mL | Freq: Once | INTRAMUSCULAR | Status: AC
  Administered 2016-02-15: 15:00:00 via EPIDURAL
  Filled 2016-02-15: qty 10

## 2016-02-15 MED ORDER — ROPIVACAINE HCL 2 MG/ML IJ SOLN
INTRAMUSCULAR | Status: AC
  Filled 2016-02-15: qty 10

## 2016-02-15 MED ORDER — TRIAMCINOLONE ACETONIDE 40 MG/ML IJ SUSP
INTRAMUSCULAR | Status: AC
  Administered 2016-02-15: 40 mg
  Filled 2016-02-15: qty 1

## 2016-02-15 MED ORDER — LIDOCAINE HCL (PF) 1 % IJ SOLN
INTRAMUSCULAR | Status: AC
  Filled 2016-02-15: qty 5

## 2016-02-15 MED ORDER — IOPAMIDOL (ISOVUE-M 200) INJECTION 41%
INTRAMUSCULAR | Status: AC
  Filled 2016-02-15: qty 10

## 2016-02-15 MED ORDER — OXYCODONE-ACETAMINOPHEN 5-325 MG PO TABS: 1.0000 | ORAL_TABLET | Freq: Three times a day (TID) | ORAL | 0 refills | Status: DC | PRN

## 2016-02-15 NOTE — Progress Notes (Signed)
Safety precautions to be maintained throughout the outpatient stay will include: orient to surroundings, keep bed in low position, maintain call bell within reach at all times, provide assistance with transfer out of bed and ambulation.  

## 2016-02-15 NOTE — Patient Instructions (Addendum)
You were given 2 prescriptions for Percocet today. Pain Management Discharge Instructions  General Discharge Instructions :  If you need to reach your doctor call: Monday-Friday 8:00 am - 4:00 pm at [phone] or toll free [phone].  After clinic hours [phone] to have operator reach doctor.  Bring all of your medication bottles to all your appointments in the pain clinic.  To cancel or reschedule your appointment with Pain Management please remember to call 24 hours in advance to avoid a fee.  Refer to the educational materials which you have been given on: General Risks, I had my Procedure. Discharge Instructions, Post Sedation.  Post Procedure Instructions:  The drugs you were given will stay in your system until tomorrow, so for the next 24 hours you should not drive, make any legal decisions or drink any alcoholic beverages.  You may eat anything you prefer, but it is better to start with liquids then soups and crackers, and gradually work up to solid foods.  Please notify your doctor immediately if you have any unusual bleeding, trouble breathing or pain that is not related to your normal pain.  Depending on the type of procedure that was done, some parts of your body may feel week and/or numb.  This usually clears up by tonight or the next day.  Walk with the use of an assistive device or accompanied by an adult for the 24 hours.  You may use ice on the affected area for the first 24 hours.  Put ice in a Ziploc bag and cover with a towel and place against area 15 minutes on 15 minutes off.  You may switch to heat after 24 hours.Epidural Steroid Injection Patient Information  Description: The epidural space surrounds the nerves as they exit the spinal cord.  In some patients, the nerves can be compressed and inflamed by a bulging disc or a tight spinal canal (spinal stenosis).  By injecting steroids into the epidural space, we can bring irritated nerves into direct  contact with a potentially helpful medication.  These steroids act directly on the irritated nerves and can reduce swelling and inflammation which often leads to decreased pain.  Epidural steroids may be injected anywhere along the spine and from the neck to the low back depending upon the location of your pain.   After numbing the skin with local anesthetic (like Novocaine), a small needle is passed into the epidural space slowly.  You may experience a sensation of pressure while this is being done.  The entire block usually last less than 10 minutes.  Conditions which may be treated by epidural steroids:   Low back and leg pain  Neck and arm pain  Spinal stenosis  Post-laminectomy syndrome  Herpes zoster (shingles) pain  Pain from compression fractures  Preparation for the injection:  1. Do not eat any solid food or dairy products within 8 hours of your appointment.  2. You may drink clear liquids up to 3 hours before appointment.  Clear liquids include water, black coffee, juice or soda.  No milk or cream please. 3. You may take your regular medication, including pain medications, with a sip of water before your appointment  Diabetics should hold regular insulin (if taken separately) and take 1/2 normal NPH dos the morning of the procedure.  Carry some sugar containing items with you to your appointment. 4. A driver must accompany you and be prepared to drive you home after your procedure.  5. Bring all your current medications with  your. 6. An IV may be inserted and sedation may be given at the discretion of the physician.   7. A blood pressure cuff, EKG and other monitors will often be applied during the procedure.  Some patients may need to have extra oxygen administered for a short period. 8. You will be asked to provide medical information, including your allergies, prior to the procedure.  We must know immediately if you are taking blood thinners (like Coumadin/Warfarin)  Or if  you are allergic to IV iodine contrast (dye). We must know if you could possible be pregnant.  Possible side-effects:  Bleeding from needle site  Infection (rare, may require surgery)  Nerve injury (rare)  Numbness & tingling (temporary)  Difficulty urinating (rare, temporary)  Spinal headache ( a headache worse with upright posture)  Light -headedness (temporary)  Pain at injection site (several days)  Decreased blood pressure (temporary)  Weakness in arm/leg (temporary)  Pressure sensation in back/neck (temporary)  Call if you experience:  Fever/chills associated with headache or increased back/neck pain.  Headache worsened by an upright position.  New onset weakness or numbness of an extremity below the injection site  Hives or difficulty breathing (go to the emergency room)  Inflammation or drainage at the infection site  Severe back/neck pain  Any new symptoms which are concerning to you  Please note:  Although the local anesthetic injected can often make your back or neck feel good for several hours after the injection, the pain will likely return.  It takes 3-7 days for steroids to work in the epidural space.  You may not notice any pain relief for at least that one week.  If effective, we will often do a series of three injections spaced 3-6 weeks apart to maximally decrease your pain.  After the initial series, we generally will wait several months before considering a repeat injection of the same type.  If you have any questions, please call [phone] [facility]

## 2016-02-16 ENCOUNTER — Telehealth: Payer: Self-pay

## 2016-02-16 NOTE — Telephone Encounter (Signed)
Post procedure phone call.  Patient states she is doing very well. 

## 2016-02-19 NOTE — Progress Notes (Signed)
Subjective:  Patient ID: Deborah Poole, female    DOB: [birth year]  Age: 80 y.o. MRN: [id number]  CC: Back Pain (low ); Leg Pain (bilateral and posterior); and Hip Pain (bilateral)  Procedure:L5-S1 epidural steroid under fluoroscopic guidance with no sedation   Previous procedure: Lumbar facet block last visit    previous  Series of 3 epidural steroids ending in August  HPI Deborah Poole presents for reevaluation. At her last visit she had a lumbar facet block and she reports no significant improvement with this. Presently she reports that she is miserable regarding her low back pain with persistent pain it's radiating into the posterior legs and hips and in the calves. She continues to take her Percocet twice a day and this gives her some relief however she struggles to do any physical therapy exercises and continues in the role of primary care support for her husband who is debilitated. She requests a repeat epidural today as these have given her good relief previously. Otherwise she is in her usual state of health regarding her lower extremity strength and functioning and bowel bladder function. Based on her narcotic assessment sheet she's tolerating her medications well and these continue to give her good support and relief.  History [name] has a past medical history of Breast cancer ([place]); Cataract; CHF (congestive heart failure) ([place]); COPD (chronic obstructive pulmonary disease) ([place]); Heart attack; Hypertension; Irritable bowel syndrome (IBS); and Thyroid disease.   She has a past surgical history that includes Cholecystectomy; Breast surgery; Cardiac surgery; Eye surgery; STENT PLACEMENT VASCULAR ([place] [facility]); Rotator cuff repair; and Abdominal hysterectomy.   Her family history includes Hypertension in her other.She reports that she has quit smoking. She has never used smokeless tobacco. She reports that she drinks alcohol. She reports that she does not use  drugs.   ---------------------------------------------------------------------------------------------------------------------- Past Medical History:  Diagnosis Date  . Breast cancer ([place])   . Cataract   . CHF (congestive heart failure) ([place])   . COPD (chronic obstructive pulmonary disease) ([place])   . Heart attack   . Hypertension   . Irritable bowel syndrome (IBS)   . Thyroid disease     Past Surgical History:  Procedure Laterality Date  . ABDOMINAL HYSTERECTOMY    . BREAST SURGERY    . CARDIAC SURGERY    . CHOLECYSTECTOMY    . EYE SURGERY    . ROTATOR CUFF REPAIR    . STENT PLACEMENT VASCULAR ([facility])      Family History  Problem Relation Age of Onset  . Hypertension Other     Social History  Substance Use Topics  . Smoking status: Former Research scientist (life sciences)  . Smokeless tobacco: Never Used  . Alcohol use 0.0 oz/week     Comment: once a month or so     ---------------------------------------------------------------------------------------------------------------------- Social History   Social History  . Marital status: Married    Spouse name: N/A  . Number of children: N/A  . Years of education: N/A   Social History Main Topics  . Smoking status: Former Research scientist (life sciences)  . Smokeless tobacco: Never Used  . Alcohol use 0.0 oz/week     Comment: once a month or so   . Drug use: No  . Sexual activity: Not Asked   Other Topics Concern  . None   Social History Narrative  . None      ----------------------------------------------------------------------------------------------------------------------  ROS Review of Systems No changes are reported   Objective:  BP (!) 175/81   Pulse 68  Temp 97.8 F (36.6 C) (Oral)   Resp 18   Ht 5' (1.524 m)   Wt 186 lb (84.4 kg)   SpO2 94%   BMI 36.33 kg/m   Physical Exam  Patient is alert and oriented cooperative and compliant Pupils are equally round reactive light EOMI Heart is regular rate and rhythm without  murmur Lungs are clear  Inspection low back reveals some mild paraspinous muscle tenderness but no overt trigger points. She does report a positive straight leg raise on the left side.   Assessment & Plan:   Lexus was seen today for back pain, leg pain and hip pain.  Diagnoses and all orders for this visit:  DDD (degenerative disc disease), lumbar -     triamcinolone acetonide (KENALOG-40) injection 40 mg; 1 mL (40 mg total) by Other route once. -     sodium chloride flush (NS) 0.9 % injection 10 mL; 10 mLs by Other route once. -     ropivacaine (PF) 2 mg/ml (0.2%) (NAROPIN) epidural 10 mL; 10 mLs by Epidural route once. -     lidocaine (PF) (XYLOCAINE) 1 % injection 5 mL; Inject 5 mLs into the skin once. -     iopamidol (ISOVUE-M) 41 % intrathecal injection 20 mL; 20 mLs by Other route once as needed for contrast. -     Lumbar Epidural Injection; Future -     MR LUMBAR SPINE WO CONTRAST; Future  Facet arthritis of lumbosacral region (HCC) -     LUMBAR FACET(MEDIAL BRANCH NERVE BLOCK) MBNB  Sciatica of left side associated with disorder of lumbar spine -     triamcinolone acetonide (KENALOG-40) injection 40 mg; 1 mL (40 mg total) by Other route once. -     sodium chloride flush (NS) 0.9 % injection 10 mL; 10 mLs by Other route once. -     ropivacaine (PF) 2 mg/ml (0.2%) (NAROPIN) epidural 10 mL; 10 mLs by Epidural route once. -     lidocaine (PF) (XYLOCAINE) 1 % injection 5 mL; Inject 5 mLs into the skin once. -     iopamidol (ISOVUE-M) 41 % intrathecal injection 20 mL; 20 mLs by Other route once as needed for contrast. -     Lumbar Epidural Injection; Future -     MR LUMBAR SPINE WO CONTRAST; Future  Other orders -     ropivacaine (PF) 2 mg/ml (0.2%) (NAROPIN) 2 MG/ML epidural;  -     triamcinolone acetonide (KENALOG-40) 40 MG/ML injection;  -     Discontinue: oxyCODONE-acetaminophen (PERCOCET/ROXICET) 5-325 MG tablet; Take 1 tablet by mouth every 8 (eight) hours as needed. Reported  on 10/16/2015 -     oxyCODONE-acetaminophen (PERCOCET/ROXICET) 5-325 MG tablet; Take 1 tablet by mouth every 8 (eight) hours as needed. Reported on 10/16/2015 -     iopamidol (ISOVUE-M) 41 % intrathecal injection;  -     sodium chloride 0.9 % injection;  -     lidocaine (PF) (XYLOCAINE) 1 % injection;      ----------------------------------------------------------------------------------------------------------------------  Problem List Items Addressed This Visit    None    Visit Diagnoses    DDD (degenerative disc disease), lumbar    -  Primary   Relevant Medications   diclofenac (VOLTAREN) 75 MG EC tablet   ibuprofen (ADVIL,MOTRIN) 200 MG tablet   triamcinolone acetonide (KENALOG-40) injection 40 mg (Completed)   sodium chloride flush (NS) 0.9 % injection 10 mL   ropivacaine (PF) 2 mg/ml (0.2%) (NAROPIN) epidural 10 mL (Completed)  lidocaine (PF) (XYLOCAINE) 1 % injection 5 mL   iopamidol (ISOVUE-M) 41 % intrathecal injection 20 mL   oxyCODONE-acetaminophen (PERCOCET/ROXICET) 5-325 MG tablet   Other Relevant Orders   Lumbar Epidural Injection   MR LUMBAR SPINE WO CONTRAST   Facet arthritis of lumbosacral region (HCC)       Relevant Medications   diclofenac (VOLTAREN) 75 MG EC tablet   ibuprofen (ADVIL,MOTRIN) 200 MG tablet   triamcinolone acetonide (KENALOG-40) injection 40 mg (Completed)   oxyCODONE-acetaminophen (PERCOCET/ROXICET) 5-325 MG tablet   Sciatica of left side associated with disorder of lumbar spine       Relevant Medications   LORazepam (ATIVAN) 0.5 MG tablet   triamcinolone acetonide (KENALOG-40) injection 40 mg (Completed)   sodium chloride flush (NS) 0.9 % injection 10 mL   ropivacaine (PF) 2 mg/ml (0.2%) (NAROPIN) epidural 10 mL (Completed)   lidocaine (PF) (XYLOCAINE) 1 % injection 5 mL   iopamidol (ISOVUE-M) 41 % intrathecal injection 20 mL   Other Relevant Orders   Lumbar Epidural Injection   MR LUMBAR SPINE WO CONTRAST       ---------------------------------------------------------------------------------------------------------------------- Assessment plan. 1. Degenerative disc disease with history of right side greater than left sciatica. She continues to have severe sciatica-like pain that has responded favorably to epidural steroid injections in the past and she presently is in severe pain. I think it is reasonable to proceed with a repeat epidural injection today and we will refill her medications and I'm scheduling her for diagnostic MRI with return to clinic in 6 weeks.  ----------------------------------------------------------------------------------------------------------------------  I am having Ms. Quang maintain her carvedilol, omeprazole, simvastatin, spironolactone, ALPRAZolam, cholecalciferol, Magnesium, NIFEdipine, oyster calcium, albuterol, VOLTAREN, HYDROcodone-acetaminophen, fluticasone, Biotin, Melatonin, ibuprofen, diclofenac, LORazepam, ibuprofen, MAGNESIUM PO, and oxyCODONE-acetaminophen. We administered ropivacaine (PF) 2 mg/ml (0.2%), triamcinolone acetonide, triamcinolone acetonide, ropivacaine (PF) 2 mg/ml (0.2%), and sodium chloride. We will continue to administer triamcinolone acetonide, sodium chloride flush, ropivacaine (PF) 2 mg/ml (0.2%), lidocaine (PF), triamcinolone acetonide, sodium chloride flush, ropivacaine (PF) 2 mg/ml (0.2%), midazolam, lidocaine (PF), lactated ringers, iopamidol, triamcinolone acetonide, sodium chloride flush, ropivacaine (PF) 2 mg/ml (0.2%), midazolam, lidocaine (PF), lactated ringers, sodium chloride flush, lidocaine (PF), and iopamidol.   Meds ordered this encounter  Medications  . diclofenac (VOLTAREN) 75 MG EC tablet  . LORazepam (ATIVAN) 0.5 MG tablet  . ibuprofen (ADVIL,MOTRIN) 200 MG tablet    Sig: Take by mouth.  Marland Kitchen MAGNESIUM PO    Sig: Take by mouth.  . ropivacaine (PF) 2 mg/ml (0.2%) (NAROPIN) 2 MG/ML epidural    Florene Glen, Patti: cabinet override   . triamcinolone acetonide (KENALOG-40) 40 MG/ML injection    Florene Glen, Patti: cabinet override  . DISCONTD: oxyCODONE-acetaminophen (PERCOCET/ROXICET) 5-325 MG tablet    Sig: Take 1 tablet by mouth every 8 (eight) hours as needed. Reported on 10/16/2015    Dispense:  60 tablet    Refill:  0  . triamcinolone acetonide (KENALOG-40) injection 40 mg  . sodium chloride flush (NS) 0.9 % injection 10 mL  . ropivacaine (PF) 2 mg/ml (0.2%) (NAROPIN) epidural 10 mL  . lidocaine (PF) (XYLOCAINE) 1 % injection 5 mL  . iopamidol (ISOVUE-M) 41 % intrathecal injection 20 mL  . oxyCODONE-acetaminophen (PERCOCET/ROXICET) 5-325 MG tablet    Sig: Take 1 tablet by mouth every 8 (eight) hours as needed. Reported on 10/16/2015    Dispense:  60 tablet    Refill:  0    Fill on [id number]  . iopamidol (ISOVUE-M) 41 % intrathecal injection    Florene Glen,  Patti: cabinet override  . sodium chloride 0.9 % injection    Florene Glen, Patti: cabinet override  . lidocaine (PF) (XYLOCAINE) 1 % injection    Florene Glen, Patti: cabinet override    Procedure:  L5-S1 epidural steroid under fluoroscopic guidance with no sedation   Procedure: L5-S1 LESI with fluoroscopic guidance and moderate sedation  NOTE: The risks, benefits, and expectations of the procedure have been discussed and explained to the patient who was understanding and in agreement with suggested treatment plan. No guarantees were made.  DESCRIPTION OF PROCEDURE: Lumbar epidural steroid injection with IV Versed, EKG, blood pressure, pulse, and pulse oximetry monitoring. The procedure was performed with the patient in the prone position under fluoroscopic guidance. A local anesthetic skin wheal of 1.5% plain lidocaine was performed at the appropriate site after fluoroscopic identifictation  Using strict aseptic technique, I then advanced an 18-gauge Tuohy epidural needle in the midline via loss-of-resistance to saline technique. There was negative aspiration for heme or  CSF.   I then confirmed position with both AP and Lateral fluoroscan. At L5-S1  A total of 5 mL of Preservative-Free normal saline mixed with 40 mg of Kenalog and 1cc Ropicaine 0.2 percent was injected incrementally via the  epidurally placed needle. The needle was removed. The patient tolerated the injection well and was convalesced and discharged to home in stable condition. Should the patient have any post procedure difficulty they have been instructed on how to contact us for assistance.      Molli Barrows, MD

## 2016-03-27 ENCOUNTER — Ambulatory Visit: Payer: Medicare Other | Attending: Anesthesiology | Admitting: Anesthesiology

## 2016-03-27 ENCOUNTER — Encounter: Payer: Self-pay | Admitting: Anesthesiology

## 2016-03-27 VITALS — BP 171/81 | HR 72 | Temp 98.1°F | Resp 16 | Ht 60.0 in | Wt 186.0 lb

## 2016-03-27 DIAGNOSIS — M25551 Pain in right hip: Secondary | ICD-10-CM | POA: Insufficient documentation

## 2016-03-27 DIAGNOSIS — M539 Dorsopathy, unspecified: Secondary | ICD-10-CM

## 2016-03-27 DIAGNOSIS — Z87891 Personal history of nicotine dependence: Secondary | ICD-10-CM | POA: Insufficient documentation

## 2016-03-27 DIAGNOSIS — M5442 Lumbago with sciatica, left side: Secondary | ICD-10-CM | POA: Diagnosis not present

## 2016-03-27 DIAGNOSIS — Z5189 Encounter for other specified aftercare: Secondary | ICD-10-CM | POA: Insufficient documentation

## 2016-03-27 DIAGNOSIS — Z9049 Acquired absence of other specified parts of digestive tract: Secondary | ICD-10-CM | POA: Insufficient documentation

## 2016-03-27 DIAGNOSIS — M5386 Other specified dorsopathies, lumbar region: Secondary | ICD-10-CM

## 2016-03-27 DIAGNOSIS — Z9889 Other specified postprocedural states: Secondary | ICD-10-CM | POA: Insufficient documentation

## 2016-03-27 DIAGNOSIS — M4697 Unspecified inflammatory spondylopathy, lumbosacral region: Secondary | ICD-10-CM | POA: Diagnosis not present

## 2016-03-27 DIAGNOSIS — M5136 Other intervertebral disc degeneration, lumbar region: Secondary | ICD-10-CM

## 2016-03-27 DIAGNOSIS — Z90710 Acquired absence of both cervix and uterus: Secondary | ICD-10-CM | POA: Diagnosis not present

## 2016-03-27 DIAGNOSIS — M47817 Spondylosis without myelopathy or radiculopathy, lumbosacral region: Secondary | ICD-10-CM

## 2016-03-27 MED ORDER — OXYCODONE-ACETAMINOPHEN 5-325 MG PO TABS: 1.0000 | ORAL_TABLET | Freq: Three times a day (TID) | ORAL | 0 refills | Status: DC | PRN

## 2016-03-27 NOTE — Patient Instructions (Signed)
GENERAL RISKS AND COMPLICATIONS  What are the risk, side effects and possible complications? Generally speaking, most procedures are safe.  However, with any procedure there are risks, side effects, and the possibility of complications.  The risks and complications are dependent upon the sites that are lesioned, or the type of nerve block to be performed.  The closer the procedure is to the spine, the more serious the risks are.  Great care is taken when placing the radio frequency needles, block needles or lesioning probes, but sometimes complications can occur. 1. Infection: Any time there is an injection through the skin, there is a risk of infection.  This is why sterile conditions are used for these blocks.  There are four possible types of infection. 1. Localized skin infection. 2. Central Nervous System Infection-This can be in the form of Meningitis, which can be deadly. 3. Epidural Infections-This can be in the form of an epidural abscess, which can cause pressure inside of the spine, causing compression of the spinal cord with subsequent paralysis. This would require an emergency surgery to decompress, and there are no guarantees that the patient would recover from the paralysis. 4. Discitis-This is an infection of the intervertebral discs.  It occurs in about 1% of discography procedures.  It is difficult to treat and it may lead to surgery.        2. Pain: the needles have to go through skin and soft tissues, will cause soreness.       3. Damage to internal structures:  The nerves to be lesioned may be near blood vessels or    other nerves which can be potentially damaged.       4. Bleeding: Bleeding is more common if the patient is taking blood thinners such as  aspirin, Coumadin, Ticiid, Plavix, etc., or if he/she have some genetic predisposition  such as hemophilia. Bleeding into the spinal canal can cause compression of the spinal  cord with subsequent paralysis.  This would require an  emergency surgery to  decompress and there are no guarantees that the patient would recover from the  paralysis.       5. Pneumothorax:  Puncturing of a lung is a possibility, every time a needle is introduced in  the area of the chest or upper back.  Pneumothorax refers to free air around the  collapsed lung(s), inside of the thoracic cavity (chest cavity).  Another two possible  complications related to a similar event would include: Hemothorax and Chylothorax.   These are variations of the Pneumothorax, where instead of air around the collapsed  lung(s), you may have blood or chyle, respectively.       6. Spinal headaches: They may occur with any procedures in the area of the spine.       7. Persistent CSF (Cerebro-Spinal Fluid) leakage: This is a rare problem, but may occur  with prolonged intrathecal or epidural catheters either due to the formation of a fistulous  track or a dural tear.       8. Nerve damage: By working so close to the spinal cord, there is always a possibility of  nerve damage, which could be as serious as a permanent spinal cord injury with  paralysis.       9. Death:  Although rare, severe deadly allergic reactions known as "Anaphylactic  reaction" can occur to any of the medications used.      10. Worsening of the symptoms:  We can always make thing worse.    What are the chances of something like this happening? Chances of any of this occuring are extremely low.  By statistics, you have more of a chance of getting killed in a motor vehicle accident: while driving to the hospital than any of the above occurring .  Nevertheless, you should be aware that they are possibilities.  In general, it is similar to taking a shower.  Everybody knows that you can slip, hit your head and get killed.  Does that mean that you should not shower again?  Nevertheless always keep in mind that statistics do not mean anything if you happen to be on the wrong side of them.  Even if a procedure has a 1  (one) in a 1,000,000 (million) chance of going wrong, it you happen to be that one..Also, keep in mind that by statistics, you have more of a chance of having something go wrong when taking medications.  Who should not have this procedure? If you are on a blood thinning medication (e.g. Coumadin, Plavix, see list of "Blood Thinners"), or if you have an active infection going on, you should not have the procedure.  If you are taking any blood thinners, please inform your physician.  How should I prepare for this procedure?  Do not eat or drink anything at least six hours prior to the procedure.  Bring a driver with you .  It cannot be a taxi.  Come accompanied by an adult that can drive you back, and that is strong enough to help you if your legs get weak or numb from the local anesthetic.  Take all of your medicines the morning of the procedure with just enough water to swallow them.  If you have diabetes, make sure that you are scheduled to have your procedure done first thing in the morning, whenever possible.  If you have diabetes, take only half of your insulin dose and notify our nurse that you have done so as soon as you arrive at the clinic.  If you are diabetic, but only take blood sugar pills (oral hypoglycemic), then do not take them on the morning of your procedure.  You may take them after you have had the procedure.  Do not take aspirin or any aspirin-containing medications, at least eleven (11) days prior to the procedure.  They may prolong bleeding.  Wear loose fitting clothing that may be easy to take off and that you would not mind if it got stained with Betadine or blood.  Do not wear any jewelry or perfume  Remove any nail coloring.  It will interfere with some of our monitoring equipment.  NOTE: Remember that this is not meant to be interpreted as a complete list of all possible complications.  Unforeseen problems may occur.  BLOOD THINNERS The following drugs  contain aspirin or other products, which can cause increased bleeding during surgery and should not be taken for 2 weeks prior to and 1 week after surgery.  If you should need take something for relief of minor pain, you may take acetaminophen which is found in Tylenol,m Datril, Anacin-3 and Panadol. It is not blood thinner. The products listed below are.  Do not take any of the products listed below in addition to any listed on your instruction sheet.  A.P.C or A.P.C with Codeine Codeine Phosphate Capsules #3 Ibuprofen Ridaura  ABC compound Congesprin Imuran rimadil  Advil Cope Indocin Robaxisal  Alka-Seltzer Effervescent Pain Reliever and Antacid Coricidin or Coricidin-D  Indomethacin Rufen    Alka-Seltzer plus Cold Medicine Cosprin Ketoprofen S-A-C Tablets  Anacin Analgesic Tablets or Capsules Coumadin Korlgesic Salflex  Anacin Extra Strength Analgesic tablets or capsules CP-2 Tablets Lanoril Salicylate  Anaprox Cuprimine Capsules Levenox Salocol  Anexsia-D Dalteparin Magan Salsalate  Anodynos Darvon compound Magnesium Salicylate Sine-off  Ansaid Dasin Capsules Magsal Sodium Salicylate  Anturane Depen Capsules Marnal Soma  APF Arthritis pain formula Dewitt's Pills Measurin Stanback  Argesic Dia-Gesic Meclofenamic Sulfinpyrazone  Arthritis Bayer Timed Release Aspirin Diclofenac Meclomen Sulindac  Arthritis pain formula Anacin Dicumarol Medipren Supac  Analgesic (Safety coated) Arthralgen Diffunasal Mefanamic Suprofen  Arthritis Strength Bufferin Dihydrocodeine Mepro Compound Suprol  Arthropan liquid Dopirydamole Methcarbomol with Aspirin Synalgos  ASA tablets/Enseals Disalcid Micrainin Tagament  Ascriptin Doan's Midol Talwin  Ascriptin A/D Dolene Mobidin Tanderil  Ascriptin Extra Strength Dolobid Moblgesic Ticlid  Ascriptin with Codeine Doloprin or Doloprin with Codeine Momentum Tolectin  Asperbuf Duoprin Mono-gesic Trendar  Aspergum Duradyne Motrin or Motrin IB Triminicin  Aspirin  plain, buffered or enteric coated Durasal Myochrisine Trigesic  Aspirin Suppositories Easprin Nalfon Trillsate  Aspirin with Codeine Ecotrin Regular or Extra Strength Naprosyn Uracel  Atromid-S Efficin Naproxen Ursinus  Auranofin Capsules Elmiron Neocylate Vanquish  Axotal Emagrin Norgesic Verin  Azathioprine Empirin or Empirin with Codeine Normiflo Vitamin E  Azolid Emprazil Nuprin Voltaren  Bayer Aspirin plain, buffered or children's or timed BC Tablets or powders Encaprin Orgaran Warfarin Sodium  Buff-a-Comp Enoxaparin Orudis Zorpin  Buff-a-Comp with Codeine Equegesic Os-Cal-Gesic   Buffaprin Excedrin plain, buffered or Extra Strength Oxalid   Bufferin Arthritis Strength Feldene Oxphenbutazone   Bufferin plain or Extra Strength Feldene Capsules Oxycodone with Aspirin   Bufferin with Codeine Fenoprofen Fenoprofen Pabalate or Pabalate-SF   Buffets II Flogesic Panagesic   Buffinol plain or Extra Strength Florinal or Florinal with Codeine Panwarfarin   Buf-Tabs Flurbiprofen Penicillamine   Butalbital Compound Four-way cold tablets Penicillin   Butazolidin Fragmin Pepto-Bismol   Carbenicillin Geminisyn Percodan   Carna Arthritis Reliever Geopen Persantine   Carprofen Gold's salt Persistin   Chloramphenicol Goody's Phenylbutazone   Chloromycetin Haltrain Piroxlcam   Clmetidine heparin Plaquenil   Cllnoril Hyco-pap Ponstel   Clofibrate Hydroxy chloroquine Propoxyphen         Before stopping any of these medications, be sure to consult the physician who ordered them.  Some, such as Coumadin (Warfarin) are ordered to prevent or treat serious conditions such as "deep thrombosis", "pumonary embolisms", and other heart problems.  The amount of time that you may need off of the medication may also vary with the medication and the reason for which you were taking it.  If you are taking any of these medications, please make sure you notify your pain physician before you undergo any  procedures.         Epidural Steroid Injection An epidural steroid injection is a shot of steroid medicine and numbing medicine that is given into the space between the spinal cord and the bones in your back (epidural space). The shot helps relieve pain caused by an irritated or swollen nerve root. The amount of pain relief you get from the injection depends on what is causing the nerve to be swollen and irritated, and how long your pain lasts. You are more likely to benefit from this injection if your pain is strong and comes on suddenly rather than if you have had pain for a long time. Tell a health care provider about:  Any allergies you have.  All medicines you are taking, including vitamins, herbs,   eye drops, creams, and over-the-counter medicines.  Any problems you or family members have had with anesthetic medicines.  Any blood disorders you have.  Any surgeries you have had.  Any medical conditions you have.  Whether you are pregnant or may be pregnant. What are the risks? Generally, this is a safe procedure. However, problems may occur, including:  Headache.  Bleeding.  Infection.  Allergic reaction to medicines.  Damage to your nerves.  What happens before the procedure? Staying hydrated Follow instructions from your health care provider about hydration, which may include:  Up to 2 hours before the procedure - you may continue to drink clear liquids, such as water, clear fruit juice, black coffee, and plain tea.  Eating and drinking restrictions Follow instructions from your health care provider about eating and drinking, which may include:  8 hours before the procedure - stop eating heavy meals or foods such as meat, fried foods, or fatty foods.  6 hours before the procedure - stop eating light meals or foods, such as toast or cereal.  6 hours before the procedure - stop drinking milk or drinks that contain milk.  2 hours before the procedure - stop  drinking clear liquids.  Medicine  You may be given medicines to lower anxiety.  Ask your health care provider about: ? Changing or stopping your regular medicines. This is especially important if you are taking diabetes medicines or blood thinners. ? Taking medicines such as aspirin and ibuprofen. These medicines can thin your blood. Do not take these medicines before your procedure if your health care provider instructs you not to. General instructions  Plan to have someone take you home from the hospital or clinic. What happens during the procedure?  You may receive a medicine to help you relax (sedative).  You will be asked to lie on your abdomen.  The injection site will be cleaned.  A numbing medicine (local anesthetic) will be used to numb the injection site.  A needle will be inserted through your skin into the epidural space. You may feel some discomfort when this happens. An X-ray machine will be used to make sure the needle is put as close as possible to the affected nerve.  A steroid medicine and a local anesthetic will be injected into the epidural space.  The needle will be removed.  A bandage (dressing) will be put over the injection site. What happens after the procedure?  Your blood pressure, heart rate, breathing rate, and blood oxygen level will be monitored until the medicines you were given have worn off.  Your arm or leg may feel weak or numb for a few hours.  The injection site may feel sore.  Do not drive for 24 hours if you received a sedative. This information is not intended to replace advice given to you by your health care provider. Make sure you discuss any questions you have with your health care provider. Document Released: 06/25/2007 Document Revised: 08/30/2015 Document Reviewed: 07/04/2015 Elsevier Interactive Patient Education  2017 [employer]  

## 2016-03-27 NOTE — Progress Notes (Signed)
Safety precautions to be maintained throughout the outpatient stay will include: orient to surroundings, keep bed in low position, maintain call bell within reach at all times, provide assistance with transfer out of bed and ambulation.  

## 2016-03-28 NOTE — Progress Notes (Signed)
Subjective:  Patient ID: Deborah Poole, female    DOB: May 30, 1935  Age: 80 y.o. MRN: [id number]  CC: Back Pain (lower); Leg Pain (both legs worse on the right); and Hip Pain ( both but more on the right)  Procedure: None  Previous Procedure:L5-S1 epidural steroid under fluoroscopic guidance with no sedation   Previous procedure: Lumbar facet block last visit    previous  Series of 3 epidural steroids ending in August  HPI Deborah Poole presents for reevaluation. She was last seen November 16 at which point she had an L5-S1 epidural which gave her approximately 25-50% improvement in her low back pain for the next 2 weeks. She has occasional right leg pain but this also has been better and she desires to proceed with a repeat epidural today. Otherwise she is in her usual state of health with no new changes in the quality characteristic or distribution of her pain. She reports that at times she is still quite miserable as her epidural was 6 weeks ago. Her medications do give her relief and she is taking Vicodin 3 times a day and doing well based on her narcotic assessment sheet.  History Deborah Poole has a past medical history of Breast cancer ([place]); Cataract; CHF (congestive heart failure) ([place]); COPD (chronic obstructive pulmonary disease) ([place]); Heart attack; Hypertension; Irritable bowel syndrome (IBS); and Thyroid disease.   She has a past surgical history that includes Cholecystectomy; Breast surgery; Cardiac surgery; Eye surgery; STENT PLACEMENT VASCULAR ([facility]); Rotator cuff repair; and Abdominal hysterectomy.   Her family history includes Hypertension in her other.She reports that she has quit smoking. She has never used smokeless tobacco. She reports that she drinks alcohol. She reports that she does not use drugs.   ---------------------------------------------------------------------------------------------------------------------- Past Medical History:  Diagnosis Date  . Breast cancer ([place])    . Cataract   . CHF (congestive heart failure) ([place])   . COPD (chronic obstructive pulmonary disease) ([place])   . Heart attack   . Hypertension   . Irritable bowel syndrome (IBS)   . Thyroid disease     Past Surgical History:  Procedure Laterality Date  . ABDOMINAL HYSTERECTOMY    . BREAST SURGERY    . CARDIAC SURGERY    . CHOLECYSTECTOMY    . EYE SURGERY    . ROTATOR CUFF REPAIR    . STENT PLACEMENT VASCULAR ([facility])      Family History  Problem Relation Age of Onset  . Hypertension Other     Social History  Substance Use Topics  . Smoking status: Former Research scientist (life sciences)  . Smokeless tobacco: Never Used  . Alcohol use 0.0 oz/week     Comment: once a month or so     ---------------------------------------------------------------------------------------------------------------------- Social History   Social History  . Marital status: Married    Spouse name: N/A  . Number of children: N/A  . Years of education: N/A   Social History Main Topics  . Smoking status: Former Research scientist (life sciences)  . Smokeless tobacco: Never Used  . Alcohol use 0.0 oz/week     Comment: once a month or so   . Drug use: No  . Sexual activity: Not Asked   Other Topics Concern  . None   Social History Narrative  . None      ----------------------------------------------------------------------------------------------------------------------  ROS Review of Systems No changes are reported   Objective:  BP (!) 171/81   Pulse 72   Temp 98.1 F (36.7 C) (Oral)   Resp 16  Ht 5' (1.524 m)   Wt 186 lb (84.4 kg)   SpO2 95%   BMI 36.33 kg/m   Physical Exam  Patient is alert and oriented cooperative and compliant Pupils are equally round reactive light EOMI Heart is regular rate and rhythm without murmur Lungs are clear  Inspection low back reveals some mild paraspinous muscle tenderness but no overt trigger points.  Assessment & Plan:   Deborah Poole was seen today for back pain, leg pain and hip  pain.  Diagnoses and all orders for this visit:  Facet arthritis of lumbosacral region [facility])  Sciatica of left side associated with disorder of lumbar spine -     Lumbar Epidural Injection; Future  DDD (degenerative disc disease), lumbar -     Lumbar Epidural Injection; Future  Other orders -     Discontinue: oxyCODONE-acetaminophen (PERCOCET/ROXICET) 5-325 MG tablet; Take 1 tablet by mouth every 8 (eight) hours as needed. Reported on 10/16/2015 -     oxyCODONE-acetaminophen (PERCOCET/ROXICET) 5-325 MG tablet; Take 1 tablet by mouth every 8 (eight) hours as needed. Reported on 10/16/2015     ----------------------------------------------------------------------------------------------------------------------  Problem List Items Addressed This Visit    None    Visit Diagnoses    Facet arthritis of lumbosacral region [facility])    -  Primary   Relevant Medications   oxyCODONE-acetaminophen (PERCOCET/ROXICET) 5-325 MG tablet   Sciatica of left side associated with disorder of lumbar spine       Relevant Orders   Lumbar Epidural Injection   DDD (degenerative disc disease), lumbar       Relevant Medications   oxyCODONE-acetaminophen (PERCOCET/ROXICET) 5-325 MG tablet   Other Relevant Orders   Lumbar Epidural Injection      ---------------------------------------------------------------------------------------------------------------------- Assessment plan. I had a long discussion with Deborah Poole regarding her low back pain. She has had 6 injections over the last 8-9 months and I feel that we need to extend into February before any repeat epidurals. Unfortunately the epidural injections are the only modality that has given her significant relief other than the Vicodin. She has failed conservative therapy. She is due for an MRI evaluation at the beginning of January to further assess for low back pathology. In the meantime I have encouraged her to continue with low-dose anti-inflammatory medicine  in addition to the Vicodin with stretching strengthening exercises and ambulation as tolerated. Unfortunately she has a very difficult situation as I think she would likely be a high risk surgical candidate as well. She is to return to clinic in 2 months for reevaluation and possible epidural steroid injection at that time ----------------------------------------------------------------------------------------------------------------------  I am having Ms. Lana maintain her carvedilol, omeprazole, simvastatin, spironolactone, ALPRAZolam, cholecalciferol, Magnesium, NIFEdipine, oyster calcium, albuterol, VOLTAREN, HYDROcodone-acetaminophen, fluticasone, Biotin, Melatonin, ibuprofen, diclofenac, LORazepam, ibuprofen, MAGNESIUM PO, and oxyCODONE-acetaminophen. We will continue to administer triamcinolone acetonide, sodium chloride flush, ropivacaine (PF) 2 mg/mL (0.2%), lidocaine (PF), triamcinolone acetonide, sodium chloride flush, ropivacaine (PF) 2 mg/mL (0.2%), midazolam, lidocaine (PF), lactated ringers, iopamidol, triamcinolone acetonide, sodium chloride flush, ropivacaine (PF) 2 mg/mL (0.2%), midazolam, lidocaine (PF), lactated ringers, sodium chloride flush, lidocaine (PF), and iopamidol.   Meds ordered this encounter  Medications  . DISCONTD: oxyCODONE-acetaminophen (PERCOCET/ROXICET) 5-325 MG tablet    Sig: Take 1 tablet by mouth every 8 (eight) hours as needed. Reported on 10/16/2015    Dispense:  90 tablet    Refill:  0    Fill on [id number]  . oxyCODONE-acetaminophen (PERCOCET/ROXICET) 5-325 MG tablet    Sig: Take 1 tablet by  mouth every 8 (eight) hours as needed. Reported on 10/16/2015    Dispense:  90 tablet    Refill:  0    Fill on [id number]      Molli Barrows, MD

## 2016-04-08 ENCOUNTER — Ambulatory Visit
Admission: RE | Admit: 2016-04-08 | Discharge: 2016-04-08 | Disposition: A | Discharge status: Home / Self Care | Payer: Medicare Other | Source: Ambulatory Visit | Attending: Anesthesiology | Admitting: Anesthesiology

## 2016-04-08 DIAGNOSIS — M48061 Spinal stenosis, lumbar region without neurogenic claudication: Secondary | ICD-10-CM | POA: Diagnosis not present

## 2016-04-08 DIAGNOSIS — M5136 Other intervertebral disc degeneration, lumbar region: Secondary | ICD-10-CM | POA: Insufficient documentation

## 2016-04-08 DIAGNOSIS — M5386 Other specified dorsopathies, lumbar region: Secondary | ICD-10-CM

## 2016-04-08 DIAGNOSIS — M545 Low back pain: Secondary | ICD-10-CM | POA: Diagnosis not present

## 2016-04-08 DIAGNOSIS — M539 Dorsopathy, unspecified: Secondary | ICD-10-CM | POA: Insufficient documentation

## 2016-05-16 ENCOUNTER — Ambulatory Visit
Admission: RE | Admit: 2016-05-16 | Discharge: 2016-05-16 | Disposition: A | Discharge status: Home / Self Care | Payer: Medicare Other | Source: Ambulatory Visit | Attending: Anesthesiology | Admitting: Anesthesiology

## 2016-05-16 ENCOUNTER — Other Ambulatory Visit: Payer: Self-pay | Admitting: Anesthesiology

## 2016-05-16 ENCOUNTER — Ambulatory Visit (HOSPITAL_COMMUNITY): Admission: RE | Admit: 2016-05-16 | Payer: Medicare Other | Source: Ambulatory Visit

## 2016-05-16 ENCOUNTER — Ambulatory Visit: Payer: Medicare Other | Attending: Anesthesiology | Admitting: Anesthesiology

## 2016-05-16 ENCOUNTER — Encounter: Payer: Self-pay | Admitting: Anesthesiology

## 2016-05-16 VITALS — BP 168/77 | HR 62 | Temp 97.2°F | Resp 18 | Ht 60.0 in | Wt 184.0 lb

## 2016-05-16 DIAGNOSIS — M5432 Sciatica, left side: Secondary | ICD-10-CM | POA: Insufficient documentation

## 2016-05-16 DIAGNOSIS — Z9049 Acquired absence of other specified parts of digestive tract: Secondary | ICD-10-CM | POA: Diagnosis not present

## 2016-05-16 DIAGNOSIS — Z87891 Personal history of nicotine dependence: Secondary | ICD-10-CM | POA: Diagnosis not present

## 2016-05-16 DIAGNOSIS — Z9889 Other specified postprocedural states: Secondary | ICD-10-CM | POA: Insufficient documentation

## 2016-05-16 DIAGNOSIS — R52 Pain, unspecified: Secondary | ICD-10-CM | POA: Insufficient documentation

## 2016-05-16 DIAGNOSIS — M5136 Other intervertebral disc degeneration, lumbar region: Secondary | ICD-10-CM | POA: Diagnosis not present

## 2016-05-16 DIAGNOSIS — Z90710 Acquired absence of both cervix and uterus: Secondary | ICD-10-CM | POA: Diagnosis not present

## 2016-05-16 DIAGNOSIS — Z853 Personal history of malignant neoplasm of breast: Secondary | ICD-10-CM | POA: Insufficient documentation

## 2016-05-16 DIAGNOSIS — M48062 Spinal stenosis, lumbar region with neurogenic claudication: Secondary | ICD-10-CM | POA: Diagnosis not present

## 2016-05-16 DIAGNOSIS — M79604 Pain in right leg: Secondary | ICD-10-CM | POA: Diagnosis not present

## 2016-05-16 DIAGNOSIS — Z0001 Encounter for general adult medical examination with abnormal findings: Secondary | ICD-10-CM | POA: Diagnosis not present

## 2016-05-16 DIAGNOSIS — M5386 Other specified dorsopathies, lumbar region: Secondary | ICD-10-CM

## 2016-05-16 DIAGNOSIS — M539 Dorsopathy, unspecified: Secondary | ICD-10-CM

## 2016-05-16 MED ORDER — TRIAMCINOLONE ACETONIDE 40 MG/ML IJ SUSP
INTRAMUSCULAR | Status: AC
  Administered 2016-05-16: 15:00:00
  Filled 2016-05-16: qty 1

## 2016-05-16 MED ORDER — ROPIVACAINE HCL 2 MG/ML IJ SOLN
INTRAMUSCULAR | Status: AC
  Administered 2016-05-16: 15:00:00
  Filled 2016-05-16: qty 20

## 2016-05-16 MED ORDER — LIDOCAINE HCL (PF) 1 % IJ SOLN
INTRAMUSCULAR | Status: AC
  Administered 2016-05-16: 15:00:00
  Filled 2016-05-16: qty 5

## 2016-05-16 MED ORDER — SODIUM CHLORIDE 0.9 % IJ SOLN
INTRAMUSCULAR | Status: AC
  Administered 2016-05-16: 15:00:00
  Filled 2016-05-16: qty 10

## 2016-05-16 MED ORDER — ROPIVACAINE HCL 2 MG/ML IJ SOLN: 10.0000 mL | Freq: Once | INTRAMUSCULAR | Status: AC

## 2016-05-16 MED ORDER — TRIAMCINOLONE ACETONIDE 40 MG/ML IJ SUSP: 40.0000 mg | Freq: Once | INTRAMUSCULAR | Status: AC

## 2016-05-16 MED ORDER — IOPAMIDOL (ISOVUE-M 200) INJECTION 41%
INTRAMUSCULAR | Status: AC
  Administered 2016-05-16: 15:00:00
  Filled 2016-05-16: qty 10

## 2016-05-16 MED ORDER — IOPAMIDOL (ISOVUE-M 200) INJECTION 41%: 20.0000 mL | Freq: Once | INTRAMUSCULAR | Status: AC | PRN

## 2016-05-16 MED ORDER — SODIUM CHLORIDE 0.9% FLUSH: 10.0000 mL | Freq: Once | INTRAVENOUS | Status: AC

## 2016-05-16 MED ORDER — LIDOCAINE HCL (PF) 1 % IJ SOLN: 5.0000 mL | Freq: Once | INTRAMUSCULAR | Status: AC

## 2016-05-16 MED ORDER — OXYCODONE-ACETAMINOPHEN 5-325 MG PO TABS: 1.0000 | ORAL_TABLET | Freq: Three times a day (TID) | ORAL | 0 refills | Status: DC | PRN

## 2016-05-16 NOTE — Progress Notes (Signed)
Subjective:  Patient ID: Deborah Poole, female    DOB: 12/26/1935  Age: 81 y.o. MRN: [id number]  CC: Back Pain (lower more on the right) and Leg Pain (bilateral nearly down to ankle. )  Procedure: L5-S1 epidural steroid under fluoroscopic guidance without sedation  Previous Procedure November 2017 L5-S1 epidural steroid under fluoroscopic guidance with no sedation   Previous procedure: Lumbar facet block last visit    previous  Series of 3 epidural steroids ending in August 2017  HPI Deborah Poole returns for reevaluation today. She was last seen in December and doing well and prior to that had had an epidural injection in November 2017. She reports that the pain that responded well to the epidural injection has returned and is severe prompting her to request for a repeat epidural today. It is of the same quality and characteristic as before and she reports that she did very well with her last injection that yielded 75-80% improvement in her low back pain and right lower extremity pain. She is now experiencing some radiating pain from the low back into the right calf and buttocks but no change in lower extremity strength or function or bowel bladder function. She's taking her medications as prescribed with good relief from these no significant side effects noted and based on her narcotic assessment sheet these are well tolerated and yielding good effect. We have reviewed the [facility] practitioner database information and it is appropriate.  History Deborah Poole has a past medical history of Breast cancer ([place]); Cataract; CHF (congestive heart failure) ([place]); COPD (chronic obstructive pulmonary disease) ([place]); Heart attack; Hypertension; Irritable bowel syndrome (IBS); and Thyroid disease.   She has a past surgical history that includes Cholecystectomy; Breast surgery; Cardiac surgery; Eye surgery; STENT PLACEMENT VASCULAR ([place] HX); Rotator cuff repair; and Abdominal hysterectomy.   Her family history  includes Hypertension in her other.She reports that she has quit smoking. She has never used smokeless tobacco. She reports that she drinks alcohol. She reports that she does not use drugs.   ---------------------------------------------------------------------------------------------------------------------- Past Medical History:  Diagnosis Date  . Breast cancer ([place])   . Cataract   . CHF (congestive heart failure) ([place])   . COPD (chronic obstructive pulmonary disease) ([place])   . Heart attack   . Hypertension   . Irritable bowel syndrome (IBS)   . Thyroid disease     Past Surgical History:  Procedure Laterality Date  . ABDOMINAL HYSTERECTOMY    . BREAST SURGERY    . CARDIAC SURGERY    . CHOLECYSTECTOMY    . EYE SURGERY    . ROTATOR CUFF REPAIR    . STENT PLACEMENT VASCULAR ([facility] HX)      Family History  Problem Relation Age of Onset  . Hypertension Other     Social History  Substance Use Topics  . Smoking status: Former Research scientist (life sciences)  . Smokeless tobacco: Never Used  . Alcohol use 0.0 oz/week     Comment: once a month or so     ---------------------------------------------------------------------------------------------------------------------- Social History   Social History  . Marital status: Married    Spouse name: N/A  . Number of children: N/A  . Years of education: N/A   Social History Main Topics  . Smoking status: Former Research scientist (life sciences)  . Smokeless tobacco: Never Used  . Alcohol use 0.0 oz/week     Comment: once a month or so   . Drug use: No  . Sexual activity: Not Asked   Other Topics Concern  .  None   Social History Narrative  . None      ----------------------------------------------------------------------------------------------------------------------  ROS Review of Systems No changes are reported   Objective:  BP (!) 168/77   Pulse 62   Temp 97.2 F (36.2 C)   Resp 18   Ht 5' (1.524 m)   Wt 184 lb (83.5 kg)   SpO2 97%   BMI 35.94 kg/m    Physical Exam  Patient is alert and oriented cooperative and compliant Pupils are equally round reactive light EOMI Heart is regular rate and rhythm without murmur Lungs are clear  Inspection low back reveals some mild paraspinous muscle tenderness but no overt trigger points.She does have a straight leg raise that is positive on the right side  Assessment & Plan:   Deborah Poole was seen today for back pain and leg pain.  Diagnoses and all orders for this visit:  Spinal stenosis of lumbar region with neurogenic claudication -     triamcinolone acetonide (KENALOG-40) injection 40 mg; 1 mL (40 mg total) by Other route once. -     sodium chloride flush (NS) 0.9 % injection 10 mL; 10 mLs by Other route once. -     ropivacaine (PF) 2 mg/mL (0.2%) (NAROPIN) injection 10 mL; 10 mLs by Epidural route once. -     lidocaine (PF) (XYLOCAINE) 1 % injection 5 mL; Inject 5 mLs into the skin once. -     iopamidol (ISOVUE-M) 41 % intrathecal injection 20 mL; 20 mLs by Other route once as needed for contrast.  Sciatica of left side associated with disorder of lumbar spine -     Lumbar Epidural Injection -     Lumbar Epidural Injection -     triamcinolone acetonide (KENALOG-40) injection 40 mg; 1 mL (40 mg total) by Other route once. -     sodium chloride flush (NS) 0.9 % injection 10 mL; 10 mLs by Other route once. -     ropivacaine (PF) 2 mg/mL (0.2%) (NAROPIN) injection 10 mL; 10 mLs by Epidural route once. -     lidocaine (PF) (XYLOCAINE) 1 % injection 5 mL; Inject 5 mLs into the skin once. -     iopamidol (ISOVUE-M) 41 % intrathecal injection 20 mL; 20 mLs by Other route once as needed for contrast.  DDD (degenerative disc disease), lumbar -     Lumbar Epidural Injection -     Lumbar Epidural Injection  Other orders -     oxyCODONE-acetaminophen (PERCOCET/ROXICET) 5-325 MG tablet; Take 1 tablet by mouth every 8 (eight) hours as needed. Reported on 10/16/2015 -     ropivacaine (PF) 2 mg/mL (0.2%)  (NAROPIN) 2 MG/ML injection;  -     iopamidol (ISOVUE-M) 41 % intrathecal injection;  -     lidocaine (PF) (XYLOCAINE) 1 % injection;  -     sodium chloride 0.9 % injection;  -     triamcinolone acetonide (KENALOG-40) 40 MG/ML injection;      ----------------------------------------------------------------------------------------------------------------------  Problem List Items Addressed This Visit    None    Visit Diagnoses    Spinal stenosis of lumbar region with neurogenic claudication    -  Primary   Relevant Medications   triamcinolone acetonide (KENALOG-40) injection 40 mg   sodium chloride flush (NS) 0.9 % injection 10 mL   ropivacaine (PF) 2 mg/mL (0.2%) (NAROPIN) injection 10 mL   lidocaine (PF) (XYLOCAINE) 1 % injection 5 mL   iopamidol (ISOVUE-M) 41 % intrathecal injection 20 mL  Sciatica of left side associated with disorder of lumbar spine       Relevant Medications   triamcinolone acetonide (KENALOG-40) injection 40 mg   sodium chloride flush (NS) 0.9 % injection 10 mL   ropivacaine (PF) 2 mg/mL (0.2%) (NAROPIN) injection 10 mL   lidocaine (PF) (XYLOCAINE) 1 % injection 5 mL   iopamidol (ISOVUE-M) 41 % intrathecal injection 20 mL   DDD (degenerative disc disease), lumbar       Relevant Medications   triamcinolone acetonide (KENALOG-40) injection 40 mg   oxyCODONE-acetaminophen (PERCOCET/ROXICET) 5-325 MG tablet   triamcinolone acetonide (KENALOG-40) 40 MG/ML injection (Completed)      ---------------------------------------------------------------------------------------------------------------------- Assessment plan:  1. We will proceed with a lumbar epidural steroid at L5-S1 for her right side L5 and S1 radiculitis on clinical exam. We have gone over the risks and benefits of the procedure with her in full detail and all questions are answered. She is to return to clinic in 1-2 months for repeat injection. Furthermore she is to continue with her current  medication regimen with refills given today. She just filled 1 medication for this coming month so her next medication will be dated 4 March of this year 2. Continue back stretching strengthening exercises as reviewed with her today and continue walking for core strengthening. ----------------------------------------------------------------------------------------------------------------------  I am having Deborah Poole maintain her carvedilol, omeprazole, simvastatin, spironolactone, ALPRAZolam, cholecalciferol, Magnesium, NIFEdipine, oyster calcium, albuterol, VOLTAREN, HYDROcodone-acetaminophen, fluticasone, Biotin, Melatonin, ibuprofen, diclofenac, LORazepam, ibuprofen, MAGNESIUM PO, docusate sodium, and oxyCODONE-acetaminophen. We administered ropivacaine (PF) 2 mg/mL (0.2%), iopamidol, lidocaine (PF), sodium chloride, and triamcinolone acetonide. We will continue to administer triamcinolone acetonide, sodium chloride flush, ropivacaine (PF) 2 mg/mL (0.2%), lidocaine (PF), triamcinolone acetonide, sodium chloride flush, ropivacaine (PF) 2 mg/mL (0.2%), midazolam, lidocaine (PF), lactated ringers, iopamidol, triamcinolone acetonide, sodium chloride flush, ropivacaine (PF) 2 mg/mL (0.2%), midazolam, lidocaine (PF), lactated ringers, sodium chloride flush, lidocaine (PF), iopamidol, triamcinolone acetonide, sodium chloride flush, ropivacaine (PF) 2 mg/mL (0.2%), lidocaine (PF), and iopamidol.   Meds ordered this encounter  Medications  . docusate sodium (COLACE) 100 MG capsule    Sig: Take 100 mg by mouth daily as needed for mild constipation.  . triamcinolone acetonide (KENALOG-40) injection 40 mg  . sodium chloride flush (NS) 0.9 % injection 10 mL  . ropivacaine (PF) 2 mg/mL (0.2%) (NAROPIN) injection 10 mL  . lidocaine (PF) (XYLOCAINE) 1 % injection 5 mL  . iopamidol (ISOVUE-M) 41 % intrathecal injection 20 mL  . oxyCODONE-acetaminophen (PERCOCET/ROXICET) 5-325 MG tablet    Sig: Take 1 tablet by  mouth every 8 (eight) hours as needed. Reported on 10/16/2015    Dispense:  90 tablet    Refill:  0    Fill on [id number]  . ropivacaine (PF) 2 mg/mL (0.2%) (NAROPIN) 2 MG/ML injection    Donneta Romberg, Dena: cabinet override  . iopamidol (ISOVUE-M) 41 % intrathecal injection    Donneta Romberg, Dena: cabinet override  . lidocaine (PF) (XYLOCAINE) 1 % injection    Donneta Romberg, Dena: cabinet override  . sodium chloride 0.9 % injection    Donneta Romberg, Dena: cabinet override  . triamcinolone acetonide (KENALOG-40) 40 MG/ML injection    Donneta Romberg, Dena: cabinet override    Procedure: L5-S1 epidural steroid under fluoroscopic guidance without sedation   Procedure: At L5-S1 LESI with fluoroscopic guidance and moderate sedation  NOTE: The risks, benefits, and expectations of the procedure have been discussed and explained to the patient who was understanding and in agreement with suggested treatment plan. No guarantees were  made.  DESCRIPTION OF PROCEDURE: Lumbar epidural steroid injection .., EKG, blood pressure, pulse, and pulse oximetry monitoring. The procedure was performed with the patient in the prone position under fluoroscopic guidance. A local anesthetic skin wheal of 1.5% plain lidocaine was performed at the appropriate site after fluoroscopic identifictation  Using strict aseptic technique, I then advanced an 18-gauge Tuohy epidural needle in the midline via loss-of-resistance to saline technique. There was negative aspiration for heme or  CSF.  I then confirmed position with both AP and Lateral fluoroscan. At L5-S1  I injected Isovue 2 cc yielding good epidural spread and followed this with A total of 5 mL of Preservative-Free normal saline mixed with 40 mg of Kenalog and 1cc Ropicaine 0.2 percent was injected incrementally via the  epidurally placed needle. The needle was removed. The patient tolerated the injection well and was convalesced and discharged to home in stable condition. Should the patient have  any post procedure difficulty they have been instructed on how to contact us for assistance.    Molli Barrows, MD

## 2016-05-16 NOTE — Patient Instructions (Signed)
Pain Management Discharge Instructions  General Discharge Instructions :  If you need to reach your doctor call: Monday-Friday 8:00 am - 4:00 pm at [phone] or toll free [phone].  After clinic hours [phone] to have operator reach doctor.  Bring all of your medication bottles to all your appointments in the pain clinic.  To cancel or reschedule your appointment with Pain Management please remember to call 24 hours in advance to avoid a fee.  Refer to the educational materials which you have been given on: General Risks, I had my Procedure. Discharge Instructions, Post Sedation.  Post Procedure Instructions:  The drugs you were given will stay in your system until tomorrow, so for the next 24 hours you should not drive, make any legal decisions or drink any alcoholic beverages.  You may eat anything you prefer, but it is better to start with liquids then soups and crackers, and gradually work up to solid foods.  Please notify your doctor immediately if you have any unusual bleeding, trouble breathing or pain that is not related to your normal pain.  Depending on the type of procedure that was done, some parts of your body may feel week and/or numb.  This usually clears up by tonight or the next day.  Walk with the use of an assistive device or accompanied by an adult for the 24 hours.  You may use ice on the affected area for the first 24 hours.  Put ice in a Ziploc bag and cover with a towel and place against area 15 minutes on 15 minutes off.  You may switch to heat after 24 hours.Epidural Steroid Injection Patient Information  Description: The epidural space surrounds the nerves as they exit the spinal cord.  In some patients, the nerves can be compressed and inflamed by a bulging disc or a tight spinal canal (spinal stenosis).  By injecting steroids into the epidural space, we can bring irritated nerves into direct contact with a potentially helpful medication.  These  steroids act directly on the irritated nerves and can reduce swelling and inflammation which often leads to decreased pain.  Epidural steroids may be injected anywhere along the spine and from the neck to the low back depending upon the location of your pain.   After numbing the skin with local anesthetic (like Novocaine), a small needle is passed into the epidural space slowly.  You may experience a sensation of pressure while this is being done.  The entire block usually last less than 10 minutes.  Conditions which may be treated by epidural steroids:   Low back and leg pain  Neck and arm pain  Spinal stenosis  Post-laminectomy syndrome  Herpes zoster (shingles) pain  Pain from compression fractures  Preparation for the injection:  1. Do not eat any solid food or dairy products within 8 hours of your appointment.  2. You may drink clear liquids up to 3 hours before appointment.  Clear liquids include water, black coffee, juice or soda.  No milk or cream please. 3. You may take your regular medication, including pain medications, with a sip of water before your appointment  Diabetics should hold regular insulin (if taken separately) and take 1/2 normal NPH dos the morning of the procedure.  Carry some sugar containing items with you to your appointment. 4. A driver must accompany you and be prepared to drive you home after your procedure.  5. Bring all your current medications with your. 6. An IV may be inserted and   sedation may be given at the discretion of the physician.   7. A blood pressure cuff, EKG and other monitors will often be applied during the procedure.  Some patients may need to have extra oxygen administered for a short period. 8. You will be asked to provide medical information, including your allergies, prior to the procedure.  We must know immediately if you are taking blood thinners (like Coumadin/Warfarin)  Or if you are allergic to IV iodine contrast (dye). We must  know if you could possible be pregnant.  Possible side-effects:  Bleeding from needle site  Infection (rare, may require surgery)  Nerve injury (rare)  Numbness & tingling (temporary)  Difficulty urinating (rare, temporary)  Spinal headache ( a headache worse with upright posture)  Light -headedness (temporary)  Pain at injection site (several days)  Decreased blood pressure (temporary)  Weakness in arm/leg (temporary)  Pressure sensation in back/neck (temporary)  Call if you experience:  Fever/chills associated with headache or increased back/neck pain.  Headache worsened by an upright position.  New onset weakness or numbness of an extremity below the injection site  Hives or difficulty breathing (go to the emergency room)  Inflammation or drainage at the infection site  Severe back/neck pain  Any new symptoms which are concerning to you  Please note:  Although the local anesthetic injected can often make your back or neck feel good for several hours after the injection, the pain will likely return.  It takes 3-7 days for steroids to work in the epidural space.  You may not notice any pain relief for at least that one week.  If effective, we will often do a series of three injections spaced 3-6 weeks apart to maximally decrease your pain.  After the initial series, we generally will wait several months before considering a repeat injection of the same type.  If you have any questions, please call [phone] [facility] 

## 2016-05-16 NOTE — Progress Notes (Signed)
Safety precautions to be maintained throughout the outpatient stay will include: orient to surroundings, keep bed in low position, maintain call bell within reach at all times, provide assistance with transfer out of bed and ambulation.  

## 2016-05-17 ENCOUNTER — Telehealth: Payer: Self-pay

## 2016-05-17 NOTE — Telephone Encounter (Signed)
Post procedure phone call.  Patient states she is doing just fine.  

## 2016-06-03 DIAGNOSIS — I2510 Atherosclerotic heart disease of native coronary artery without angina pectoris: Secondary | ICD-10-CM | POA: Diagnosis not present

## 2016-06-03 DIAGNOSIS — I119 Hypertensive heart disease without heart failure: Secondary | ICD-10-CM | POA: Diagnosis not present

## 2016-06-03 DIAGNOSIS — M5432 Sciatica, left side: Secondary | ICD-10-CM | POA: Diagnosis not present

## 2016-06-25 ENCOUNTER — Encounter: Payer: Self-pay | Admitting: Anesthesiology

## 2016-06-25 ENCOUNTER — Other Ambulatory Visit: Payer: Self-pay | Admitting: Anesthesiology

## 2016-06-25 ENCOUNTER — Ambulatory Visit
Admission: RE | Admit: 2016-06-25 | Discharge: 2016-06-25 | Disposition: A | Discharge status: Home / Self Care | Payer: Medicare Other | Source: Ambulatory Visit | Attending: Anesthesiology | Admitting: Anesthesiology

## 2016-06-25 ENCOUNTER — Ambulatory Visit (HOSPITAL_BASED_OUTPATIENT_CLINIC_OR_DEPARTMENT_OTHER): Payer: Medicare Other | Admitting: Anesthesiology

## 2016-06-25 VITALS — BP 159/74 | HR 71 | Temp 97.6°F | Resp 18 | Ht 60.0 in | Wt 178.0 lb

## 2016-06-25 DIAGNOSIS — M5441 Lumbago with sciatica, right side: Secondary | ICD-10-CM | POA: Insufficient documentation

## 2016-06-25 DIAGNOSIS — M48062 Spinal stenosis, lumbar region with neurogenic claudication: Secondary | ICD-10-CM

## 2016-06-25 DIAGNOSIS — M5386 Other specified dorsopathies, lumbar region: Secondary | ICD-10-CM

## 2016-06-25 DIAGNOSIS — G8929 Other chronic pain: Secondary | ICD-10-CM

## 2016-06-25 DIAGNOSIS — M539 Dorsopathy, unspecified: Secondary | ICD-10-CM

## 2016-06-25 DIAGNOSIS — Z87891 Personal history of nicotine dependence: Secondary | ICD-10-CM | POA: Insufficient documentation

## 2016-06-25 DIAGNOSIS — M79604 Pain in right leg: Secondary | ICD-10-CM | POA: Diagnosis not present

## 2016-06-25 DIAGNOSIS — R52 Pain, unspecified: Secondary | ICD-10-CM

## 2016-06-25 DIAGNOSIS — M25551 Pain in right hip: Secondary | ICD-10-CM | POA: Diagnosis not present

## 2016-06-25 MED ORDER — IOPAMIDOL (ISOVUE-M 200) INJECTION 41%
INTRAMUSCULAR | Status: AC
  Filled 2016-06-25: qty 10

## 2016-06-25 MED ORDER — TRIAMCINOLONE ACETONIDE 40 MG/ML IJ SUSP
40.0000 mg | Freq: Once | INTRAMUSCULAR | Status: AC
  Administered 2016-06-25: 40 mg
  Filled 2016-06-25: qty 1

## 2016-06-25 MED ORDER — LIDOCAINE HCL (PF) 1 % IJ SOLN
5.0000 mL | Freq: Once | INTRAMUSCULAR | Status: AC
  Administered 2016-06-25: 5 mL via SUBCUTANEOUS
  Filled 2016-06-25: qty 5

## 2016-06-25 MED ORDER — ROPIVACAINE HCL 5 MG/ML IJ SOLN
INTRAMUSCULAR | Status: AC
  Filled 2016-06-25: qty 20

## 2016-06-25 MED ORDER — ROPIVACAINE HCL 2 MG/ML IJ SOLN
10.0000 mL | Freq: Once | INTRAMUSCULAR | Status: AC
  Administered 2016-06-25: 10 mL via EPIDURAL
  Filled 2016-06-25: qty 10

## 2016-06-25 MED ORDER — SODIUM CHLORIDE 0.9 % IJ SOLN
INTRAMUSCULAR | Status: AC
  Filled 2016-06-25: qty 20

## 2016-06-25 MED ORDER — SODIUM CHLORIDE 0.9% FLUSH
10.0000 mL | Freq: Once | INTRAVENOUS | Status: AC
  Administered 2016-06-25: 10 mL

## 2016-06-25 MED ORDER — IOPAMIDOL (ISOVUE-M 200) INJECTION 41%
20.0000 mL | Freq: Once | INTRAMUSCULAR | Status: DC | PRN
Start: 2016-06-25 — End: 2016-06-26
  Administered 2016-06-25: 10 mL
  Filled 2016-06-25: qty 20

## 2016-06-25 NOTE — Patient Instructions (Signed)

## 2016-06-25 NOTE — Progress Notes (Signed)
Safety precautions to be maintained throughout the outpatient stay will include: orient to surroundings, keep bed in low position, maintain call bell within reach at all times, provide assistance with transfer out of bed and ambulation.  

## 2016-06-26 ENCOUNTER — Telehealth: Payer: Self-pay | Admitting: *Deleted

## 2016-06-26 NOTE — Telephone Encounter (Signed)
Spoke to patient re; procedure on yesterday.  Verbalizes no questions or concerns.

## 2016-06-26 NOTE — Progress Notes (Signed)
Subjective:  Patient ID: Deborah Poole, female    DOB: 1935-11-06  Age: 81 y.o. MRN: [id number]  CC: Back Pain (low and bilateral); Leg Pain (right, posterior); and Hip Pain (right)  Procedure: L5-S1 #2 of series epidural steroid under fluoroscopic guidance without sedation  Previous Procedure February L5-S1 epidural steroid    November 2017 L5-S1 epidural steroid under fluoroscopic guidance with no sedation   Previous procedure: Lumbar facet block last visit    previous  Series of 3 epidural steroids ending in August 2017  HPI Deborah Poole returns for reevaluation today. She had an epidural at her last visit in February and states that she had complete resolution of her left lower extremity pain but her right lower extremity pain persists. Overall she has a 50% reduction in her low back pain. The epidurals have helped keep her pain under good control and the quality characteristic distribution of her low back pain is otherwise unchanged with no new changes in lower extremity strength or function or bowel or bladder function.  History Deborah Poole has a past medical history of Breast cancer ([place]); Cataract; CHF (congestive heart failure) ([place]); COPD (chronic obstructive pulmonary disease) ([place]); Heart attack; Hypertension; Irritable bowel syndrome (IBS); and Thyroid disease.   She has a past surgical history that includes Cholecystectomy; Breast surgery; Cardiac surgery; Eye surgery; STENT PLACEMENT VASCULAR ([place] [facility]); Rotator cuff repair; and Abdominal hysterectomy.   Her family history includes Hypertension in her other.She reports that she has quit smoking. She has never used smokeless tobacco. She reports that she drinks alcohol. She reports that she does not use drugs.   ---------------------------------------------------------------------------------------------------------------------- Past Medical History:  Diagnosis Date  . Breast cancer ([place])   . Cataract   . CHF (congestive heart failure)  ([place])   . COPD (chronic obstructive pulmonary disease) ([place])   . Heart attack   . Hypertension   . Irritable bowel syndrome (IBS)   . Thyroid disease     Past Surgical History:  Procedure Laterality Date  . ABDOMINAL HYSTERECTOMY    . BREAST SURGERY    . CARDIAC SURGERY    . CHOLECYSTECTOMY    . EYE SURGERY    . ROTATOR CUFF REPAIR    . STENT PLACEMENT VASCULAR ([facility] HX)      Family History  Problem Relation Age of Onset  . Hypertension Other     Social History  Substance Use Topics  . Smoking status: Former Research scientist (life sciences)  . Smokeless tobacco: Never Used  . Alcohol use 0.0 oz/week     Comment: once a month or so     ---------------------------------------------------------------------------------------------------------------------- Social History   Social History  . Marital status: Married    Spouse name: N/A  . Number of children: N/A  . Years of education: N/A   Social History Main Topics  . Smoking status: Former Research scientist (life sciences)  . Smokeless tobacco: Never Used  . Alcohol use 0.0 oz/week     Comment: once a month or so   . Drug use: No  . Sexual activity: Not Asked   Other Topics Concern  . None   Social History Narrative  . None      ----------------------------------------------------------------------------------------------------------------------  ROS Review of Systems No changes are reported GI: No constipation   Objective:  BP (!) 159/74   Pulse 71   Temp 97.6 F (36.4 C) (Oral)   Resp 18   Ht 5' (1.524 m)   Wt 178 lb (80.7 kg)   SpO2 99%   BMI 34.76  kg/m   Physical Exam  Patient is alert and oriented cooperative and compliant Pupils are equally round reactive light EOMI Heart is regular rate and rhythm without murmur Lungs are clear  I  Assessment & Plan:   Deborah Poole was seen today for back pain, leg pain and hip pain.  Diagnoses and all orders for this visit:  Sciatica of left side associated with disorder of lumbar spine  Spinal  stenosis of lumbar region with neurogenic claudication  Chronic bilateral low back pain with right-sided sciatica -     triamcinolone acetonide (KENALOG-40) injection 40 mg; 1 mL (40 mg total) by Other route once. -     sodium chloride flush (NS) 0.9 % injection 10 mL; 10 mLs by Other route once. -     ropivacaine (PF) 2 mg/mL (0.2%) (NAROPIN) injection 10 mL; 10 mLs by Epidural route once. -     lidocaine (PF) (XYLOCAINE) 1 % injection 5 mL; Inject 5 mLs into the skin once. -     iopamidol (ISOVUE-M) 41 % intrathecal injection 20 mL; 20 mLs by Other route once as needed for contrast.     ----------------------------------------------------------------------------------------------------------------------  Problem List Items Addressed This Visit    None    Visit Diagnoses    Sciatica of left side associated with disorder of lumbar spine    -  Primary   Spinal stenosis of lumbar region with neurogenic claudication       Chronic bilateral low back pain with right-sided sciatica       Relevant Medications   triamcinolone acetonide (KENALOG-40) injection 40 mg (Completed)   sodium chloride flush (NS) 0.9 % injection 10 mL (Completed)   ropivacaine (PF) 2 mg/mL (0.2%) (NAROPIN) injection 10 mL (Completed)   lidocaine (PF) (XYLOCAINE) 1 % injection 5 mL (Completed)   iopamidol (ISOVUE-M) 41 % intrathecal injection 20 mL      ---------------------------------------------------------------------------------------------------------------------- Assessment plan:  1. We will proceed with a lumbar epidural steroid at L5-S1 #2 for her right side L5 and S1 radiculitis on clinical exam. We have gone over the risks and benefits of the procedure with her in full detail and all questions are answered. She is to return to clinic in 2 months for repeat injection. Furthermore she is to continue with her current medication regimen with refills given today.  2. Continue back stretching strengthening  exercises as reviewed with her today and continue walking for core strengthening. ----------------------------------------------------------------------------------------------------------------------  I am having Deborah Poole maintain her carvedilol, omeprazole, simvastatin, spironolactone, ALPRAZolam, cholecalciferol, Magnesium, NIFEdipine, oyster calcium, albuterol, VOLTAREN, HYDROcodone-acetaminophen, fluticasone, Biotin, Melatonin, ibuprofen, diclofenac, LORazepam, ibuprofen, MAGNESIUM PO, docusate sodium, and oxyCODONE-acetaminophen. We administered triamcinolone acetonide, sodium chloride flush, ropivacaine (PF) 2 mg/mL (0.2%), lidocaine (PF), and iopamidol. We will continue to administer triamcinolone acetonide, sodium chloride flush, ropivacaine (PF) 2 mg/mL (0.2%), lidocaine (PF), triamcinolone acetonide, sodium chloride flush, ropivacaine (PF) 2 mg/mL (0.2%), midazolam, lidocaine (PF), lactated ringers, iopamidol, triamcinolone acetonide, sodium chloride flush, ropivacaine (PF) 2 mg/mL (0.2%), midazolam, lidocaine (PF), lactated ringers, sodium chloride flush, lidocaine (PF), iopamidol, triamcinolone acetonide, sodium chloride flush, ropivacaine (PF) 2 mg/mL (0.2%), lidocaine (PF), and iopamidol.   Meds ordered this encounter  Medications  . triamcinolone acetonide (KENALOG-40) injection 40 mg  . sodium chloride flush (NS) 0.9 % injection 10 mL  . ropivacaine (PF) 2 mg/mL (0.2%) (NAROPIN) injection 10 mL  . lidocaine (PF) (XYLOCAINE) 1 % injection 5 mL  . iopamidol (ISOVUE-M) 41 % intrathecal injection 20 mL    Procedure: L5-S1 epidural  steroid under fluoroscopic guidance without sedation   Procedure:L5-S1 LESI #2 with fluoroscopic guidance and moderate sedation  NOTE: The risks, benefits, and expectations of the procedure have been discussed and explained to the patient who was understanding and in agreement with suggested treatment plan. No guarantees were made.  DESCRIPTION OF  PROCEDURE: Lumbar epidural steroid injection without sedation.., EKG, blood pressure, pulse, and pulse oximetry monitoring. The procedure was performed with the patient in the prone position under fluoroscopic guidance. A local anesthetic skin wheal of 1.5% plain lidocaine was performed at the appropriate site after fluoroscopic identifictation  Using strict aseptic technique, I then advanced an 18-gauge Tuohy epidural needle in the midline via loss-of-resistance to saline technique. There was negative aspiration for heme or  CSF.  I then confirmed position with both AP and Lateral fluoroscan. At L5-S1  I injected Isovue 2 cc yielding good epidural spread and followed this with A total of 5 mL of Preservative-Free normal saline mixed with 40 mg of Kenalog and 1cc Ropicaine 0.2 percent was injected incrementally via the  epidurally placed needle. The needle was removed. The patient tolerated the injection well and was convalesced and discharged to home in stable condition. Should the patient have any post procedure difficulty they have been instructed on how to contact us for assistance.    Deborah Barrows, MD

## 2016-06-27 DIAGNOSIS — E031 Congenital hypothyroidism without goiter: Secondary | ICD-10-CM | POA: Diagnosis not present

## 2016-06-27 DIAGNOSIS — I2510 Atherosclerotic heart disease of native coronary artery without angina pectoris: Secondary | ICD-10-CM | POA: Diagnosis not present

## 2016-06-27 DIAGNOSIS — I252 Old myocardial infarction: Secondary | ICD-10-CM | POA: Diagnosis not present

## 2016-06-27 DIAGNOSIS — I129 Hypertensive chronic kidney disease with stage 1 through stage 4 chronic kidney disease, or unspecified chronic kidney disease: Secondary | ICD-10-CM | POA: Diagnosis not present

## 2016-08-01 DIAGNOSIS — I2510 Atherosclerotic heart disease of native coronary artery without angina pectoris: Secondary | ICD-10-CM | POA: Diagnosis not present

## 2016-08-01 DIAGNOSIS — S060X0D Concussion without loss of consciousness, subsequent encounter: Secondary | ICD-10-CM | POA: Diagnosis not present

## 2016-08-09 ENCOUNTER — Encounter: Payer: Self-pay | Admitting: Anesthesiology

## 2016-08-09 ENCOUNTER — Ambulatory Visit: Payer: Medicare Other | Attending: Anesthesiology | Admitting: Anesthesiology

## 2016-08-09 VITALS — BP 190/90 | HR 78 | Temp 98.0°F | Resp 18 | Ht 60.0 in | Wt 178.0 lb

## 2016-08-09 DIAGNOSIS — E079 Disorder of thyroid, unspecified: Secondary | ICD-10-CM | POA: Diagnosis not present

## 2016-08-09 DIAGNOSIS — Z87891 Personal history of nicotine dependence: Secondary | ICD-10-CM | POA: Insufficient documentation

## 2016-08-09 DIAGNOSIS — M539 Dorsopathy, unspecified: Secondary | ICD-10-CM

## 2016-08-09 DIAGNOSIS — I110 Hypertensive heart disease with heart failure: Secondary | ICD-10-CM | POA: Diagnosis not present

## 2016-08-09 DIAGNOSIS — G8929 Other chronic pain: Secondary | ICD-10-CM | POA: Diagnosis not present

## 2016-08-09 DIAGNOSIS — M5442 Lumbago with sciatica, left side: Secondary | ICD-10-CM

## 2016-08-09 DIAGNOSIS — M48062 Spinal stenosis, lumbar region with neurogenic claudication: Secondary | ICD-10-CM | POA: Insufficient documentation

## 2016-08-09 DIAGNOSIS — Z853 Personal history of malignant neoplasm of breast: Secondary | ICD-10-CM | POA: Insufficient documentation

## 2016-08-09 DIAGNOSIS — M5441 Lumbago with sciatica, right side: Secondary | ICD-10-CM | POA: Diagnosis not present

## 2016-08-09 DIAGNOSIS — J449 Chronic obstructive pulmonary disease, unspecified: Secondary | ICD-10-CM | POA: Insufficient documentation

## 2016-08-09 DIAGNOSIS — I252 Old myocardial infarction: Secondary | ICD-10-CM | POA: Diagnosis not present

## 2016-08-09 DIAGNOSIS — Z8249 Family history of ischemic heart disease and other diseases of the circulatory system: Secondary | ICD-10-CM | POA: Insufficient documentation

## 2016-08-09 DIAGNOSIS — K589 Irritable bowel syndrome without diarrhea: Secondary | ICD-10-CM | POA: Diagnosis not present

## 2016-08-09 DIAGNOSIS — M5386 Other specified dorsopathies, lumbar region: Secondary | ICD-10-CM

## 2016-08-09 DIAGNOSIS — M545 Low back pain: Secondary | ICD-10-CM | POA: Diagnosis present

## 2016-08-09 DIAGNOSIS — I509 Heart failure, unspecified: Secondary | ICD-10-CM | POA: Insufficient documentation

## 2016-08-09 MED ORDER — OXYCODONE-ACETAMINOPHEN 5-325 MG PO TABS: 1.0000 | ORAL_TABLET | Freq: Three times a day (TID) | ORAL | 0 refills | Status: DC | PRN

## 2016-08-09 MED ORDER — OXYCODONE-ACETAMINOPHEN 5-325 MG PO TABS: 1.0000 | ORAL_TABLET | Freq: Three times a day (TID) | ORAL | 0 refills | Status: AC | PRN

## 2016-08-09 NOTE — Progress Notes (Signed)
Nursing Pain Medication Assessment:  Safety precautions to be maintained throughout the outpatient stay will include: orient to surroundings, keep bed in low position, maintain call bell within reach at all times, provide assistance with transfer out of bed and ambulation.  Medication Inspection Compliance: Deborah Poole did not comply with our request to bring her pills to be counted. She was reminded that bringing the medication bottles, even when empty, is a requirement.  Medication: None brought in. Pill/Patch Count: None available to be counted. Bottle Appearance: No container available. Did not bring bottle(s) to appointment. Filled Date: N/A

## 2016-08-09 NOTE — Patient Instructions (Signed)
GENERAL RISKS AND COMPLICATIONS  What are the risk, side effects and possible complications? Generally speaking, most procedures are safe.  However, with any procedure there are risks, side effects, and the possibility of complications.  The risks and complications are dependent upon the sites that are lesioned, or the type of nerve block to be performed.  The closer the procedure is to the spine, the more serious the risks are.  Great care is taken when placing the radio frequency needles, block needles or lesioning probes, but sometimes complications can occur. 1. Infection: Any time there is an injection through the skin, there is a risk of infection.  This is why sterile conditions are used for these blocks.  There are four possible types of infection. 1. Localized skin infection. 2. Central Nervous System Infection-This can be in the form of Meningitis, which can be deadly. 3. Epidural Infections-This can be in the form of an epidural abscess, which can cause pressure inside of the spine, causing compression of the spinal cord with subsequent paralysis. This would require an emergency surgery to decompress, and there are no guarantees that the patient would recover from the paralysis. 4. Discitis-This is an infection of the intervertebral discs.  It occurs in about 1% of discography procedures.  It is difficult to treat and it may lead to surgery.        2. Pain: the needles have to go through skin and soft tissues, will cause soreness.       3. Damage to internal structures:  The nerves to be lesioned may be near blood vessels or    other nerves which can be potentially damaged.       4. Bleeding: Bleeding is more common if the patient is taking blood thinners such as  aspirin, Coumadin, Ticiid, Plavix, etc., or if he/she have some genetic predisposition  such as hemophilia. Bleeding into the spinal canal can cause compression of the spinal  cord with subsequent paralysis.  This would require an  emergency surgery to  decompress and there are no guarantees that the patient would recover from the  paralysis.       5. Pneumothorax:  Puncturing of a lung is a possibility, every time a needle is introduced in  the area of the chest or upper back.  Pneumothorax refers to free air around the  collapsed lung(s), inside of the thoracic cavity (chest cavity).  Another two possible  complications related to a similar event would include: Hemothorax and Chylothorax.   These are variations of the Pneumothorax, where instead of air around the collapsed  lung(s), you may have blood or chyle, respectively.       6. Spinal headaches: They may occur with any procedures in the area of the spine.       7. Persistent CSF (Cerebro-Spinal Fluid) leakage: This is a rare problem, but may occur  with prolonged intrathecal or epidural catheters either due to the formation of a fistulous  track or a dural tear.       8. Nerve damage: By working so close to the spinal cord, there is always a possibility of  nerve damage, which could be as serious as a permanent spinal cord injury with  paralysis.       9. Death:  Although rare, severe deadly allergic reactions known as "Anaphylactic  reaction" can occur to any of the medications used.      10. Worsening of the symptoms:  We can always make thing worse.    What are the chances of something like this happening? Chances of any of this occuring are extremely low.  By statistics, you have more of a chance of getting killed in a motor vehicle accident: while driving to the hospital than any of the above occurring .  Nevertheless, you should be aware that they are possibilities.  In general, it is similar to taking a shower.  Everybody knows that you can slip, hit your head and get killed.  Does that mean that you should not shower again?  Nevertheless always keep in mind that statistics do not mean anything if you happen to be on the wrong side of them.  Even if a procedure has a 1  (one) in a 1,000,000 (million) chance of going wrong, it you happen to be that one..Also, keep in mind that by statistics, you have more of a chance of having something go wrong when taking medications.  Who should not have this procedure? If you are on a blood thinning medication (e.g. Coumadin, Plavix, see list of "Blood Thinners"), or if you have an active infection going on, you should not have the procedure.  If you are taking any blood thinners, please inform your physician.  How should I prepare for this procedure?  Do not eat or drink anything at least six hours prior to the procedure.  Bring a driver with you .  It cannot be a taxi.  Come accompanied by an adult that can drive you back, and that is strong enough to help you if your legs get weak or numb from the local anesthetic.  Take all of your medicines the morning of the procedure with just enough water to swallow them.  If you have diabetes, make sure that you are scheduled to have your procedure done first thing in the morning, whenever possible.  If you have diabetes, take only half of your insulin dose and notify our nurse that you have done so as soon as you arrive at the clinic.  If you are diabetic, but only take blood sugar pills (oral hypoglycemic), then do not take them on the morning of your procedure.  You may take them after you have had the procedure.  Do not take aspirin or any aspirin-containing medications, at least eleven (11) days prior to the procedure.  They may prolong bleeding.  Wear loose fitting clothing that may be easy to take off and that you would not mind if it got stained with Betadine or blood.  Do not wear any jewelry or perfume  Remove any nail coloring.  It will interfere with some of our monitoring equipment.  NOTE: Remember that this is not meant to be interpreted as a complete list of all possible complications.  Unforeseen problems may occur.  BLOOD THINNERS The following drugs  contain aspirin or other products, which can cause increased bleeding during surgery and should not be taken for 2 weeks prior to and 1 week after surgery.  If you should need take something for relief of minor pain, you may take acetaminophen which is found in Tylenol,m Datril, Anacin-3 and Panadol. It is not blood thinner. The products listed below are.  Do not take any of the products listed below in addition to any listed on your instruction sheet.  A.P.C or A.P.C with Codeine Codeine Phosphate Capsules #3 Ibuprofen Ridaura  ABC compound Congesprin Imuran rimadil  Advil Cope Indocin Robaxisal  Alka-Seltzer Effervescent Pain Reliever and Antacid Coricidin or Coricidin-D  Indomethacin Rufen    Alka-Seltzer plus Cold Medicine Cosprin Ketoprofen S-A-C Tablets  Anacin Analgesic Tablets or Capsules Coumadin Korlgesic Salflex  Anacin Extra Strength Analgesic tablets or capsules CP-2 Tablets Lanoril Salicylate  Anaprox Cuprimine Capsules Levenox Salocol  Anexsia-D Dalteparin Magan Salsalate  Anodynos Darvon compound Magnesium Salicylate Sine-off  Ansaid Dasin Capsules Magsal Sodium Salicylate  Anturane Depen Capsules Marnal Soma  APF Arthritis pain formula Dewitt's Pills Measurin Stanback  Argesic Dia-Gesic Meclofenamic Sulfinpyrazone  Arthritis Bayer Timed Release Aspirin Diclofenac Meclomen Sulindac  Arthritis pain formula Anacin Dicumarol Medipren Supac  Analgesic (Safety coated) Arthralgen Diffunasal Mefanamic Suprofen  Arthritis Strength Bufferin Dihydrocodeine Mepro Compound Suprol  Arthropan liquid Dopirydamole Methcarbomol with Aspirin Synalgos  ASA tablets/Enseals Disalcid Micrainin Tagament  Ascriptin Doan's Midol Talwin  Ascriptin A/D Dolene Mobidin Tanderil  Ascriptin Extra Strength Dolobid Moblgesic Ticlid  Ascriptin with Codeine Doloprin or Doloprin with Codeine Momentum Tolectin  Asperbuf Duoprin Mono-gesic Trendar  Aspergum Duradyne Motrin or Motrin IB Triminicin  Aspirin  plain, buffered or enteric coated Durasal Myochrisine Trigesic  Aspirin Suppositories Easprin Nalfon Trillsate  Aspirin with Codeine Ecotrin Regular or Extra Strength Naprosyn Uracel  Atromid-S Efficin Naproxen Ursinus  Auranofin Capsules Elmiron Neocylate Vanquish  Axotal Emagrin Norgesic Verin  Azathioprine Empirin or Empirin with Codeine Normiflo Vitamin E  Azolid Emprazil Nuprin Voltaren  Bayer Aspirin plain, buffered or children's or timed BC Tablets or powders Encaprin Orgaran Warfarin Sodium  Buff-a-Comp Enoxaparin Orudis Zorpin  Buff-a-Comp with Codeine Equegesic Os-Cal-Gesic   Buffaprin Excedrin plain, buffered or Extra Strength Oxalid   Bufferin Arthritis Strength Feldene Oxphenbutazone   Bufferin plain or Extra Strength Feldene Capsules Oxycodone with Aspirin   Bufferin with Codeine Fenoprofen Fenoprofen Pabalate or Pabalate-SF   Buffets II Flogesic Panagesic   Buffinol plain or Extra Strength Florinal or Florinal with Codeine Panwarfarin   Buf-Tabs Flurbiprofen Penicillamine   Butalbital Compound Four-way cold tablets Penicillin   Butazolidin Fragmin Pepto-Bismol   Carbenicillin Geminisyn Percodan   Carna Arthritis Reliever Geopen Persantine   Carprofen Gold's salt Persistin   Chloramphenicol Goody's Phenylbutazone   Chloromycetin Haltrain Piroxlcam   Clmetidine heparin Plaquenil   Cllnoril Hyco-pap Ponstel   Clofibrate Hydroxy chloroquine Propoxyphen         Before stopping any of these medications, be sure to consult the physician who ordered them.  Some, such as Coumadin (Warfarin) are ordered to prevent or treat serious conditions such as "deep thrombosis", "pumonary embolisms", and other heart problems.  The amount of time that you may need off of the medication may also vary with the medication and the reason for which you were taking it.  If you are taking any of these medications, please make sure you notify your pain physician before you undergo any  procedures.         Epidural Steroid Injection An epidural steroid injection is a shot of steroid medicine and numbing medicine that is given into the space between the spinal cord and the bones in your back (epidural space). The shot helps relieve pain caused by an irritated or swollen nerve root. The amount of pain relief you get from the injection depends on what is causing the nerve to be swollen and irritated, and how long your pain lasts. You are more likely to benefit from this injection if your pain is strong and comes on suddenly rather than if you have had pain for a long time. Tell a health care provider about:  Any allergies you have.  All medicines you are taking, including vitamins, herbs,   eye drops, creams, and over-the-counter medicines.  Any problems you or family members have had with anesthetic medicines.  Any blood disorders you have.  Any surgeries you have had.  Any medical conditions you have.  Whether you are pregnant or may be pregnant. What are the risks? Generally, this is a safe procedure. However, problems may occur, including:  Headache.  Bleeding.  Infection.  Allergic reaction to medicines.  Damage to your nerves.  What happens before the procedure? Staying hydrated Follow instructions from your health care provider about hydration, which may include:  Up to 2 hours before the procedure - you may continue to drink clear liquids, such as water, clear fruit juice, black coffee, and plain tea.  Eating and drinking restrictions Follow instructions from your health care provider about eating and drinking, which may include:  8 hours before the procedure - stop eating heavy meals or foods such as meat, fried foods, or fatty foods.  6 hours before the procedure - stop eating light meals or foods, such as toast or cereal.  6 hours before the procedure - stop drinking milk or drinks that contain milk.  2 hours before the procedure - stop  drinking clear liquids.  Medicine  You may be given medicines to lower anxiety.  Ask your health care provider about: ? Changing or stopping your regular medicines. This is especially important if you are taking diabetes medicines or blood thinners. ? Taking medicines such as aspirin and ibuprofen. These medicines can thin your blood. Do not take these medicines before your procedure if your health care provider instructs you not to. General instructions  Plan to have someone take you home from the hospital or clinic. What happens during the procedure?  You may receive a medicine to help you relax (sedative).  You will be asked to lie on your abdomen.  The injection site will be cleaned.  A numbing medicine (local anesthetic) will be used to numb the injection site.  A needle will be inserted through your skin into the epidural space. You may feel some discomfort when this happens. An X-ray machine will be used to make sure the needle is put as close as possible to the affected nerve.  A steroid medicine and a local anesthetic will be injected into the epidural space.  The needle will be removed.  A bandage (dressing) will be put over the injection site. What happens after the procedure?  Your blood pressure, heart rate, breathing rate, and blood oxygen level will be monitored until the medicines you were given have worn off.  Your arm or leg may feel weak or numb for a few hours.  The injection site may feel sore.  Do not drive for 24 hours if you received a sedative. This information is not intended to replace advice given to you by your health care provider. Make sure you discuss any questions you have with your health care provider. Document Released: 06/25/2007 Document Revised: 08/30/2015 Document Reviewed: 07/04/2015 Elsevier Interactive Patient Education  2017 [employer]  

## 2016-08-13 ENCOUNTER — Other Ambulatory Visit: Payer: Self-pay | Admitting: Anesthesiology

## 2016-08-13 ENCOUNTER — Ambulatory Visit: Payer: Medicare Other | Attending: Anesthesiology | Admitting: Anesthesiology

## 2016-08-13 ENCOUNTER — Ambulatory Visit
Admission: RE | Admit: 2016-08-13 | Discharge: 2016-08-13 | Disposition: A | Discharge status: Home / Self Care | Payer: Medicare Other | Source: Ambulatory Visit | Attending: Anesthesiology | Admitting: Anesthesiology

## 2016-08-13 ENCOUNTER — Encounter: Payer: Self-pay | Admitting: Anesthesiology

## 2016-08-13 VITALS — BP 174/74 | HR 66 | Temp 98.0°F | Resp 12 | Ht 60.0 in | Wt 178.0 lb

## 2016-08-13 DIAGNOSIS — J449 Chronic obstructive pulmonary disease, unspecified: Secondary | ICD-10-CM | POA: Insufficient documentation

## 2016-08-13 DIAGNOSIS — M539 Dorsopathy, unspecified: Secondary | ICD-10-CM

## 2016-08-13 DIAGNOSIS — I110 Hypertensive heart disease with heart failure: Secondary | ICD-10-CM | POA: Insufficient documentation

## 2016-08-13 DIAGNOSIS — M5442 Lumbago with sciatica, left side: Secondary | ICD-10-CM | POA: Diagnosis not present

## 2016-08-13 DIAGNOSIS — Z853 Personal history of malignant neoplasm of breast: Secondary | ICD-10-CM | POA: Diagnosis not present

## 2016-08-13 DIAGNOSIS — M48062 Spinal stenosis, lumbar region with neurogenic claudication: Secondary | ICD-10-CM | POA: Diagnosis not present

## 2016-08-13 DIAGNOSIS — G8929 Other chronic pain: Secondary | ICD-10-CM | POA: Diagnosis not present

## 2016-08-13 DIAGNOSIS — I509 Heart failure, unspecified: Secondary | ICD-10-CM | POA: Diagnosis not present

## 2016-08-13 DIAGNOSIS — M5386 Other specified dorsopathies, lumbar region: Secondary | ICD-10-CM

## 2016-08-13 DIAGNOSIS — I252 Old myocardial infarction: Secondary | ICD-10-CM | POA: Insufficient documentation

## 2016-08-13 DIAGNOSIS — M5441 Lumbago with sciatica, right side: Secondary | ICD-10-CM | POA: Diagnosis not present

## 2016-08-13 DIAGNOSIS — K589 Irritable bowel syndrome without diarrhea: Secondary | ICD-10-CM | POA: Insufficient documentation

## 2016-08-13 DIAGNOSIS — Z87891 Personal history of nicotine dependence: Secondary | ICD-10-CM | POA: Diagnosis not present

## 2016-08-13 DIAGNOSIS — E079 Disorder of thyroid, unspecified: Secondary | ICD-10-CM | POA: Insufficient documentation

## 2016-08-13 DIAGNOSIS — R52 Pain, unspecified: Secondary | ICD-10-CM

## 2016-08-13 MED ORDER — TRIAMCINOLONE ACETONIDE 40 MG/ML IJ SUSP
40.0000 mg | Freq: Once | INTRAMUSCULAR | Status: AC
  Administered 2016-08-13: 40 mg
  Filled 2016-08-13: qty 1

## 2016-08-13 MED ORDER — LIDOCAINE HCL (PF) 1 % IJ SOLN
5.0000 mL | Freq: Once | INTRAMUSCULAR | Status: AC
  Administered 2016-08-13: 5 mL via SUBCUTANEOUS
  Filled 2016-08-13: qty 5

## 2016-08-13 MED ORDER — SODIUM CHLORIDE 0.9% FLUSH
10.0000 mL | Freq: Once | INTRAVENOUS | Status: AC
  Administered 2016-08-13: 10 mL

## 2016-08-13 MED ORDER — SODIUM CHLORIDE 0.9 % IJ SOLN
INTRAMUSCULAR | Status: AC
Start: 2016-08-13 — End: 2016-08-13
  Filled 2016-08-13: qty 10

## 2016-08-13 MED ORDER — ROPIVACAINE HCL 2 MG/ML IJ SOLN
10.0000 mL | Freq: Once | INTRAMUSCULAR | Status: AC
  Administered 2016-08-13: 10 mL via EPIDURAL
  Filled 2016-08-13: qty 10

## 2016-08-13 MED ORDER — IOPAMIDOL (ISOVUE-M 200) INJECTION 41%
20.0000 mL | Freq: Once | INTRAMUSCULAR | Status: DC | PRN
  Administered 2016-08-13: 10 mL
  Filled 2016-08-13: qty 20

## 2016-08-13 MED ORDER — IOPAMIDOL (ISOVUE-M 200) INJECTION 41%
INTRAMUSCULAR | Status: AC
Start: 2016-08-13 — End: 2016-08-13
  Filled 2016-08-13: qty 10

## 2016-08-13 NOTE — Patient Instructions (Signed)
Epidural Steroid Injection An epidural steroid injection is a shot of steroid medicine and numbing medicine that is given into the space between the spinal cord and the bones in your back (epidural space). The shot helps relieve pain caused by an irritated or swollen nerve root. The amount of pain relief you get from the injection depends on what is causing the nerve to be swollen and irritated, and how long your pain lasts. You are more likely to benefit from this injection if your pain is strong and comes on suddenly rather than if you have had pain for a long time. Tell a health care provider about:  Any allergies you have.  All medicines you are taking, including vitamins, herbs, eye drops, creams, and over-the-counter medicines.  Any problems you or family members have had with anesthetic medicines.  Any blood disorders you have.  Any surgeries you have had.  Any medical conditions you have.  Whether you are pregnant or may be pregnant. What are the risks? Generally, this is a safe procedure. However, problems may occur, including:  Headache.  Bleeding.  Infection.  Allergic reaction to medicines.  Damage to your nerves.  What happens before the procedure? Staying hydrated Follow instructions from your health care provider about hydration, which may include:  Up to 2 hours before the procedure - you may continue to drink clear liquids, such as water, clear fruit juice, black coffee, and plain tea.  Eating and drinking restrictions Follow instructions from your health care provider about eating and drinking, which may include:  8 hours before the procedure - stop eating heavy meals or foods such as meat, fried foods, or fatty foods.  6 hours before the procedure - stop eating light meals or foods, such as toast or cereal.  6 hours before the procedure - stop drinking milk or drinks that contain milk.  2 hours before the procedure - stop drinking clear  liquids.  Medicine  You may be given medicines to lower anxiety.  Ask your health care provider about: ? Changing or stopping your regular medicines. This is especially important if you are taking diabetes medicines or blood thinners. ? Taking medicines such as aspirin and ibuprofen. These medicines can thin your blood. Do not take these medicines before your procedure if your health care provider instructs you not to. General instructions  Plan to have someone take you home from the hospital or clinic. What happens during the procedure?  You may receive a medicine to help you relax (sedative).  You will be asked to lie on your abdomen.  The injection site will be cleaned.  A numbing medicine (local anesthetic) will be used to numb the injection site.  A needle will be inserted through your skin into the epidural space. You may feel some discomfort when this happens. An X-ray machine will be used to make sure the needle is put as close as possible to the affected nerve.  A steroid medicine and a local anesthetic will be injected into the epidural space.  The needle will be removed.  A bandage (dressing) will be put over the injection site. What happens after the procedure?  Your blood pressure, heart rate, breathing rate, and blood oxygen level will be monitored until the medicines you were given have worn off.  Your arm or leg may feel weak or numb for a few hours.  The injection site may feel sore.  Do not drive for 24 hours if you received a sedative. This   information is not intended to replace advice given to you by your health care provider. Make sure you discuss any questions you have with your health care provider. Document Released: 06/25/2007 Document Revised: 08/30/2015 Document Reviewed: 07/04/2015 Elsevier Interactive Patient Education  2017 [employer] Pain Management Discharge Instructions  General Discharge Instructions :  If you need to reach your  doctor call: Monday-Friday 8:00 am - 4:00 pm at [phone] or toll free [phone].  After clinic hours [phone] to have operator reach doctor.  Bring all of your medication bottles to all your appointments in the pain clinic.  To cancel or reschedule your appointment with Pain Management please remember to call 24 hours in advance to avoid a fee.  Refer to the educational materials which you have been given on: General Risks, I had my Procedure. Discharge Instructions, Post Sedation.  Post Procedure Instructions:  The drugs you were given will stay in your system until tomorrow, so for the next 24 hours you should not drive, make any legal decisions or drink any alcoholic beverages.  You may eat anything you prefer, but it is better to start with liquids then soups and crackers, and gradually work up to solid foods.  Please notify your doctor immediately if you have any unusual bleeding, trouble breathing or pain that is not related to your normal pain.  Depending on the type of procedure that was done, some parts of your body may feel week and/or numb.  This usually clears up by tonight or the next day.  Walk with the use of an assistive device or accompanied by an adult for the 24 hours.  You may use ice on the affected area for the first 24 hours.  Put ice in a Ziploc bag and cover with a towel and place against area 15 minutes on 15 minutes off.  You may switch to heat after 24 hours. 

## 2016-08-13 NOTE — Progress Notes (Signed)
Safety precautions to be maintained throughout the outpatient stay will include: orient to surroundings, keep bed in low position, maintain call bell within reach at all times, provide assistance with transfer out of bed and ambulation.  

## 2016-08-14 NOTE — Progress Notes (Signed)
Subjective:  Patient ID: Deborah Poole, female    DOB: [birth year]  Age: 81 y.o. MRN: [id number]  CC: Back Pain (lower)  Procedure:none Previous procedure: L5-S1 #2 of series epidural steroid under fluoroscopic guidance without sedation  Previous Procedure February L5-S1 epidural steroid    November 2017 L5-S1 epidural steroid under fluoroscopic guidance with no sedation   Previous procedure: Lumbar facet block last visit    previous  Series of 3 epidural steroids ending in August 2017  HPI Deborah Poole presents for reevaluation. She's had 2 previous epidurals andreturns today for medication management. She reports that she has responded favorably to the epidurals but is still having some left lower extremity pain. The quality of her low back pain is unchanged and her bowel bladder function has been stable with no change in motor function to the lower extremities. She is tolerating her medications without difficulty and these continue to help manage her recalcitrant low back pain. Otherwise she is in her usual state of health. She feels that the edurals generally give her 50% improvement in her low back and leg pain lasting 2 months.  History Deborah Poole has a past medical history of Breast cancer ([place]); Cataract; CHF (congestive heart failure) ([place]); COPD (chronic obstructive pulmonary disease) ([place]); Heart attack ([place]); Hypertension; Irritable bowel syndrome (IBS); and Thyroid disease.   She has a past surgical history that includes Cholecystectomy; Breast surgery; Cardiac surgery; Eye surgery; STENT PLACEMENT VASCULAR ([facility]); Rotator cuff repair; and Abdominal hysterectomy.   Her family history includes Hypertension in her other.She reports that she has quit smoking. She has never used smokeless tobacco. She reports that she drinks alcohol. She reports that she does not use  drugs.   ---------------------------------------------------------------------------------------------------------------------- Past Medical History:  Diagnosis Date  . Breast cancer ([place])   . Cataract   . CHF (congestive heart failure) ([place])   . COPD (chronic obstructive pulmonary disease) ([place])   . Heart attack ([place])   . Hypertension   . Irritable bowel syndrome (IBS)   . Thyroid disease     Past Surgical History:  Procedure Laterality Date  . ABDOMINAL HYSTERECTOMY    . BREAST SURGERY    . CARDIAC SURGERY    . CHOLECYSTECTOMY    . EYE SURGERY    . ROTATOR CUFF REPAIR    . STENT PLACEMENT VASCULAR ([facility] HX)      Family History  Problem Relation Age of Onset  . Hypertension Other     Social History  Substance Use Topics  . Smoking status: Former Research scientist (life sciences)  . Smokeless tobacco: Never Used  . Alcohol use 0.0 oz/week     Comment: once a month or so     ---------------------------------------------------------------------------------------------------------------------- Social History   Social History  . Marital status: Married    Spouse name: N/A  . Number of children: N/A  . Years of education: N/A   Social History Main Topics  . Smoking status: Former Research scientist (life sciences)  . Smokeless tobacco: Never Used  . Alcohol use 0.0 oz/week     Comment: once a month or so   . Drug use: No  . Sexual activity: Not Asked   Other Topics Concern  . None   Social History Narrative  . None      ----------------------------------------------------------------------------------------------------------------------  ROS Review of Systems  GI: No constipation.Marland Kitchenotherwise no change   Objective:  BP (!) 190/90   Pulse 78   Temp 98 F (36.7 C)   Resp 18   Ht 5' (1.524 m)  Wt 178 lb (80.7 kg)   SpO2 99%   BMI 34.76 kg/m   Physical Exam  Patient is alert and oriented cooperative and compliant Pupils are equally round reactive light EOMI Heart is regular rate and rhythm  without murmur Lungs are clear  She has some paraspinous muscle tenderness but no overt trigger points. She is walking with an antalgic gait I  Assessment & Plan:   Deborah Poole was seen today for back pain.  Diagnoses and all orders for this visit:  Sciatica of left side associated with disorder of lumbar spine  Spinal stenosis of lumbar region with neurogenic claudication  Chronic bilateral low back pain with right-sided sciatica  Chronic bilateral low back pain with bilateral sciatica -     Lumbar Epidural Injection; Future  Other orders -     Discontinue: oxyCODONE-acetaminophen (PERCOCET/ROXICET) 5-325 MG tablet; Take 1 tablet by mouth every 8 (eight) hours as needed. Reported on 10/16/2015 -     oxyCODONE-acetaminophen (PERCOCET/ROXICET) 5-325 MG tablet; Take 1 tablet by mouth every 8 (eight) hours as needed. Reported on 10/16/2015 (Patient not taking: Reported on 08/13/2016)     ----------------------------------------------------------------------------------------------------------------------  Problem List Items Addressed This Visit    None    Visit Diagnoses    Sciatica of left side associated with disorder of lumbar spine    -  Primary   Spinal stenosis of lumbar region with neurogenic claudication       Chronic bilateral low back pain with right-sided sciatica       Relevant Medications   oxyCODONE-acetaminophen (PERCOCET/ROXICET) 5-325 MG tablet   Chronic bilateral low back pain with bilateral sciatica       Relevant Medications   oxyCODONE-acetaminophen (PERCOCET/ROXICET) 5-325 MG tablet   Other Relevant Orders   Lumbar Epidural Injection      ---------------------------------------------------------------------------------------------------------------------- Assessment plan:  1.we discussed options regarding repeat epidural today however she cannot accommodate this with her schedule. She would like to come in as soon as possible for a third epidural in the  series. I think that it is reasonable and we will plan for this at the next available date. In the meantime we refill her medications. Ultimately she is planning on a move to [place] to be closer with her family. This will require a transition of care as well.----------------------------------------------------------------------------------------------------------------------  I am having Deborah Poole maintain her carvedilol, omeprazole, simvastatin, spironolactone, ALPRAZolam, cholecalciferol, Magnesium, NIFEdipine, oyster calcium, albuterol, VOLTAREN, HYDROcodone-acetaminophen, fluticasone, Biotin, Melatonin, ibuprofen, diclofenac, LORazepam, ibuprofen, MAGNESIUM PO, docusate sodium, and oxyCODONE-acetaminophen. We will continue to administer triamcinolone acetonide, sodium chloride flush, ropivacaine (PF) 2 mg/mL (0.2%), lidocaine (PF), triamcinolone acetonide, sodium chloride flush, ropivacaine (PF) 2 mg/mL (0.2%), midazolam, lidocaine (PF), lactated ringers, iopamidol, triamcinolone acetonide, sodium chloride flush, ropivacaine (PF) 2 mg/mL (0.2%), midazolam, lidocaine (PF), lactated ringers, sodium chloride flush, lidocaine (PF), iopamidol, triamcinolone acetonide, sodium chloride flush, ropivacaine (PF) 2 mg/mL (0.2%), lidocaine (PF), and iopamidol.   Meds ordered this encounter  Medications  . DISCONTD: oxyCODONE-acetaminophen (PERCOCET/ROXICET) 5-325 MG tablet    Sig: Take 1 tablet by mouth every 8 (eight) hours as needed. Reported on 10/16/2015    Dispense:  90 tablet    Refill:  0  . oxyCODONE-acetaminophen (PERCOCET/ROXICET) 5-325 MG tablet    Sig: Take 1 tablet by mouth every 8 (eight) hours as needed. Reported on 10/16/2015    Dispense:  90 tablet    Refill:  0    Do not fill until [id number]     Molli Barrows, MD

## 2016-08-14 NOTE — Progress Notes (Signed)
Subjective:  Patient ID: Deborah Poole, female    DOB: 08-05-1935  Age: 81 y.o. MRN: [id number]  CC: Back Pain (lower right)  Procedure: L5-S1 #3 of series epidural steroid under fluoroscopic guidance without sedation  Previous Procedure March and February February L5-S1 epidural steroid    November 2017 L5-S1 epidural steroid under fluoroscopic guidance with no sedation   Previous procedure: Lumbar facet block last visit    previous  Series of 3 epidural steroids ending in August 2017  HPI Deborah Poole presents for reevaluation today. She is continuing to show relief with her epidural steroid injections. She rates this as approximately 50% overall and desires to proceed with her third injection of the series today. She's having some right side low back pain greater than left. She gets some transient radiation into the right posterior and lateral leg. No change in strength or lower extremity function is noted bowel and bladder function have also been stable. Otherwise she is in her usual state of health and tolerating her medication management well.  History Deborah Poole has a past medical history of Breast cancer ([place]); Cataract; CHF (congestive heart failure) ([place]); COPD (chronic obstructive pulmonary disease) ([place]); Heart attack ([place]); Hypertension; Irritable bowel syndrome (IBS); and Thyroid disease.   She has a past surgical history that includes Cholecystectomy; Breast surgery; Cardiac surgery; Eye surgery; STENT PLACEMENT VASCULAR ([place] HX); Rotator cuff repair; and Abdominal hysterectomy.   Her family history includes Hypertension in her other.She reports that she has quit smoking. She has never used smokeless tobacco. She reports that she drinks alcohol. She reports that she does not use drugs.   ---------------------------------------------------------------------------------------------------------------------- Past Medical History:  Diagnosis Date  . Breast cancer ([place])   . Cataract   . CHF  (congestive heart failure) ([place])   . COPD (chronic obstructive pulmonary disease) ([place])   . Heart attack ([place])   . Hypertension   . Irritable bowel syndrome (IBS)   . Thyroid disease     Past Surgical History:  Procedure Laterality Date  . ABDOMINAL HYSTERECTOMY    . BREAST SURGERY    . CARDIAC SURGERY    . CHOLECYSTECTOMY    . EYE SURGERY    . ROTATOR CUFF REPAIR    . STENT PLACEMENT VASCULAR ([facility] HX)      Family History  Problem Relation Age of Onset  . Hypertension Other     Social History  Substance Use Topics  . Smoking status: Former Research scientist (life sciences)  . Smokeless tobacco: Never Used  . Alcohol use 0.0 oz/week     Comment: once a month or so     ---------------------------------------------------------------------------------------------------------------------- Social History   Social History  . Marital status: Married    Spouse name: N/A  . Number of children: N/A  . Years of education: N/A   Social History Main Topics  . Smoking status: Former Research scientist (life sciences)  . Smokeless tobacco: Never Used  . Alcohol use 0.0 oz/week     Comment: once a month or so   . Drug use: No  . Sexual activity: Not Asked   Other Topics Concern  . None   Social History Narrative  . None      ----------------------------------------------------------------------------------------------------------------------  ROS Review of Systems  GI: No constipationOtherwise no change   Objective:  BP (!) 174/74   Pulse 66   Temp 98 F (36.7 C) (Oral)   Resp 12   Ht 5' (1.524 m)   Wt 178 lb (80.7 kg)   SpO2 96%  BMI 34.76 kg/m   Physical Exam  Patient is alert and oriented cooperative and compliant Pupils are equally round reactive light EOMI Heart is regular rate and rhythm without murmur Lungs are clear  She has some mild paraspinous muscle tenderness but no overt trigger points I  Assessment & Plan:   Deborah Poole was seen today for back pain.  Diagnoses and all orders for this  visit:  Spinal stenosis of lumbar region with neurogenic claudication -     triamcinolone acetonide (KENALOG-40) injection 40 mg; 1 mL (40 mg total) by Other route once. -     sodium chloride flush (NS) 0.9 % injection 10 mL; 10 mLs by Other route once. -     ropivacaine (PF) 2 mg/mL (0.2%) (NAROPIN) injection 10 mL; 10 mLs by Epidural route once. -     lidocaine (PF) (XYLOCAINE) 1 % injection 5 mL; Inject 5 mLs into the skin once. -     iopamidol (ISOVUE-M) 41 % intrathecal injection 20 mL; 20 mLs by Other route once as needed for contrast.  Sciatica of left side associated with disorder of lumbar spine  Chronic bilateral low back pain with bilateral sciatica -     Lumbar Epidural Injection     ----------------------------------------------------------------------------------------------------------------------  Problem List Items Addressed This Visit    None    Visit Diagnoses    Spinal stenosis of lumbar region with neurogenic claudication    -  Primary   Relevant Medications   triamcinolone acetonide (KENALOG-40) injection 40 mg (Completed)   sodium chloride flush (NS) 0.9 % injection 10 mL (Completed)   ropivacaine (PF) 2 mg/mL (0.2%) (NAROPIN) injection 10 mL (Completed)   lidocaine (PF) (XYLOCAINE) 1 % injection 5 mL (Completed)   iopamidol (ISOVUE-M) 41 % intrathecal injection 20 mL   Sciatica of left side associated with disorder of lumbar spine       Chronic bilateral low back pain with bilateral sciatica       Relevant Medications   triamcinolone acetonide (KENALOG-40) injection 40 mg (Completed)      ---------------------------------------------------------------------------------------------------------------------- Assessment plan:  1. We will proceed with her third epidural steroid injection in series today. Our plan will be for a when necessary return for evaluation in 2 months. She is planning on moving to [place] to be closer to her family and we'll transition  care accordingly. Refills were given today and we've reviewed her [facility] practitioner database and it is appropriate.  2. Continue back stretching strengthening exercises as reviewed with her today and continue walking for core strengthening. ----------------------------------------------------------------------------------------------------------------------  I am having Ms. Deborah Poole maintain her carvedilol, omeprazole, simvastatin, spironolactone, ALPRAZolam, cholecalciferol, Magnesium, NIFEdipine, oyster calcium, albuterol, VOLTAREN, HYDROcodone-acetaminophen, fluticasone, Biotin, Melatonin, ibuprofen, diclofenac, LORazepam, ibuprofen, MAGNESIUM PO, docusate sodium, and oxyCODONE-acetaminophen. We administered triamcinolone acetonide, sodium chloride flush, ropivacaine (PF) 2 mg/mL (0.2%), lidocaine (PF), and iopamidol. We will continue to administer triamcinolone acetonide, sodium chloride flush, ropivacaine (PF) 2 mg/mL (0.2%), lidocaine (PF), triamcinolone acetonide, sodium chloride flush, ropivacaine (PF) 2 mg/mL (0.2%), midazolam, lidocaine (PF), lactated ringers, iopamidol, triamcinolone acetonide, sodium chloride flush, ropivacaine (PF) 2 mg/mL (0.2%), midazolam, lidocaine (PF), lactated ringers, sodium chloride flush, lidocaine (PF), iopamidol, triamcinolone acetonide, sodium chloride flush, ropivacaine (PF) 2 mg/mL (0.2%), lidocaine (PF), and iopamidol.   Meds ordered this encounter  Medications  . triamcinolone acetonide (KENALOG-40) injection 40 mg  . sodium chloride flush (NS) 0.9 % injection 10 mL  . ropivacaine (PF) 2 mg/mL (0.2%) (NAROPIN) injection 10 mL  . lidocaine (PF) (XYLOCAINE)  1 % injection 5 mL  . iopamidol (ISOVUE-M) 41 % intrathecal injection 20 mL    Procedure: L5-S1 epidural steroid under fluoroscopic guidance without sedation   Procedure:L5-S1 LESI #3 with fluoroscopic guidance Without sedation  NOTE: The risks, benefits, and expectations of the procedure have  been discussed and explained to the patient who was understanding and in agreement with suggested treatment plan. No guarantees were made.  DESCRIPTION OF PROCEDURE: Lumbar epidural steroid injection without sedation.., EKG, blood pressure, pulse, and pulse oximetry monitoring. The procedure was performed with the patient in the prone position under fluoroscopic guidance. A local anesthetic skin wheal of 1.5% plain lidocaine was performed at the appropriate site after fluoroscopic identifictation  Using strict aseptic technique, I then advanced an 18-gauge Tuohy epidural needle in the midline via loss-of-resistance to saline technique. There was negative aspiration for heme or  CSF.  I then confirmed position with both AP and Lateral fluoroscan. At L5-S1  I injected Isovue 2 cc yielding good epidural spread and followed this with A total of 5 mL of Preservative-Free normal saline mixed with 40 mg of Kenalog and 1cc Ropicaine 0.2 percent was injected incrementally via the  epidurally placed needle. The needle was removed. The patient tolerated the injection well and was convalesced and discharged to home in stable condition. Should the patient have any post procedure difficulty they have been instructed on how to contact us for assistance.    Molli Barrows, MD

## 2016-08-19 ENCOUNTER — Ambulatory Visit: Payer: Medicare Other | Admitting: Anesthesiology

## 2016-08-24 DIAGNOSIS — Z961 Presence of intraocular lens: Secondary | ICD-10-CM | POA: Diagnosis not present

## 2016-08-24 DIAGNOSIS — H353114 Nonexudative age-related macular degeneration, right eye, advanced atrophic with subfoveal involvement: Secondary | ICD-10-CM | POA: Diagnosis not present

## 2016-09-12 ENCOUNTER — Telehealth: Payer: Self-pay

## 2016-09-12 NOTE — Telephone Encounter (Signed)
Spoke with patient to let her know that she will need to sign a release of information for her records to be sent to [facility] in [place].  Patient states she does have a copy of her records but will need a referral.  Explained that the referral will need to come from her PCP.  Patient verbalizes understanding of information.

## 2016-09-12 NOTE — Telephone Encounter (Signed)
She has moved to [place] but the pain clinic there needs a referral and notes from [place].  Dr. Maryagnes Amos    The fax # [phone]

## 2016-09-25 DIAGNOSIS — G894 Chronic pain syndrome: Secondary | ICD-10-CM | POA: Diagnosis not present

## 2016-09-25 DIAGNOSIS — M48062 Spinal stenosis, lumbar region with neurogenic claudication: Secondary | ICD-10-CM | POA: Diagnosis not present

## 2016-09-25 DIAGNOSIS — Z79899 Other long term (current) drug therapy: Secondary | ICD-10-CM | POA: Diagnosis not present

## 2016-09-25 DIAGNOSIS — G8929 Other chronic pain: Secondary | ICD-10-CM | POA: Diagnosis not present

## 2016-09-25 DIAGNOSIS — I509 Heart failure, unspecified: Secondary | ICD-10-CM | POA: Diagnosis not present

## 2016-09-25 DIAGNOSIS — M539 Dorsopathy, unspecified: Secondary | ICD-10-CM | POA: Diagnosis not present

## 2016-09-25 DIAGNOSIS — M5441 Lumbago with sciatica, right side: Secondary | ICD-10-CM | POA: Diagnosis not present

## 2016-09-25 DIAGNOSIS — J449 Chronic obstructive pulmonary disease, unspecified: Secondary | ICD-10-CM | POA: Diagnosis not present

## 2016-09-25 DIAGNOSIS — M9983 Other biomechanical lesions of lumbar region: Secondary | ICD-10-CM | POA: Diagnosis not present

## 2016-09-25 DIAGNOSIS — M47816 Spondylosis without myelopathy or radiculopathy, lumbar region: Secondary | ICD-10-CM | POA: Diagnosis not present

## 2016-10-09 DIAGNOSIS — M48062 Spinal stenosis, lumbar region with neurogenic claudication: Secondary | ICD-10-CM | POA: Diagnosis not present

## 2016-10-09 DIAGNOSIS — M7989 Other specified soft tissue disorders: Secondary | ICD-10-CM | POA: Diagnosis not present

## 2016-10-09 DIAGNOSIS — M47816 Spondylosis without myelopathy or radiculopathy, lumbar region: Secondary | ICD-10-CM | POA: Diagnosis not present

## 2016-10-10 DIAGNOSIS — J449 Chronic obstructive pulmonary disease, unspecified: Secondary | ICD-10-CM | POA: Diagnosis not present

## 2016-10-10 DIAGNOSIS — Z23 Encounter for immunization: Secondary | ICD-10-CM | POA: Diagnosis not present

## 2016-10-10 DIAGNOSIS — C50911 Malignant neoplasm of unspecified site of right female breast: Secondary | ICD-10-CM | POA: Diagnosis not present

## 2016-10-10 DIAGNOSIS — I509 Heart failure, unspecified: Secondary | ICD-10-CM | POA: Diagnosis not present

## 2016-10-10 DIAGNOSIS — I10 Essential (primary) hypertension: Secondary | ICD-10-CM | POA: Diagnosis not present

## 2016-10-10 DIAGNOSIS — M4800 Spinal stenosis, site unspecified: Secondary | ICD-10-CM | POA: Diagnosis not present

## 2016-10-10 DIAGNOSIS — R739 Hyperglycemia, unspecified: Secondary | ICD-10-CM | POA: Diagnosis not present

## 2016-10-10 DIAGNOSIS — E7800 Pure hypercholesterolemia, unspecified: Secondary | ICD-10-CM | POA: Diagnosis not present

## 2016-10-19 DIAGNOSIS — H353114 Nonexudative age-related macular degeneration, right eye, advanced atrophic with subfoveal involvement: Secondary | ICD-10-CM | POA: Diagnosis not present

## 2016-10-31 DIAGNOSIS — M48062 Spinal stenosis, lumbar region with neurogenic claudication: Secondary | ICD-10-CM | POA: Diagnosis not present

## 2016-10-31 DIAGNOSIS — M9983 Other biomechanical lesions of lumbar region: Secondary | ICD-10-CM | POA: Diagnosis not present

## 2016-11-18 DIAGNOSIS — M9983 Other biomechanical lesions of lumbar region: Secondary | ICD-10-CM | POA: Diagnosis not present

## 2016-11-18 DIAGNOSIS — I509 Heart failure, unspecified: Secondary | ICD-10-CM | POA: Diagnosis not present

## 2016-11-18 DIAGNOSIS — M5441 Lumbago with sciatica, right side: Secondary | ICD-10-CM | POA: Diagnosis not present

## 2016-11-18 DIAGNOSIS — M48062 Spinal stenosis, lumbar region with neurogenic claudication: Secondary | ICD-10-CM | POA: Diagnosis not present

## 2016-11-18 DIAGNOSIS — M47816 Spondylosis without myelopathy or radiculopathy, lumbar region: Secondary | ICD-10-CM | POA: Diagnosis not present

## 2016-11-18 DIAGNOSIS — J449 Chronic obstructive pulmonary disease, unspecified: Secondary | ICD-10-CM | POA: Diagnosis not present

## 2016-11-18 DIAGNOSIS — M539 Dorsopathy, unspecified: Secondary | ICD-10-CM | POA: Diagnosis not present

## 2016-11-18 DIAGNOSIS — G8929 Other chronic pain: Secondary | ICD-10-CM | POA: Diagnosis not present

## 2016-11-18 DIAGNOSIS — Z79899 Other long term (current) drug therapy: Secondary | ICD-10-CM | POA: Diagnosis not present

## 2016-11-28 DIAGNOSIS — M48062 Spinal stenosis, lumbar region with neurogenic claudication: Secondary | ICD-10-CM | POA: Diagnosis not present

## 2016-11-28 DIAGNOSIS — M9983 Other biomechanical lesions of lumbar region: Secondary | ICD-10-CM | POA: Diagnosis not present

## 2016-12-09 DIAGNOSIS — M539 Dorsopathy, unspecified: Secondary | ICD-10-CM | POA: Diagnosis not present

## 2016-12-09 DIAGNOSIS — R5382 Chronic fatigue, unspecified: Secondary | ICD-10-CM | POA: Diagnosis not present

## 2016-12-09 DIAGNOSIS — Z79890 Hormone replacement therapy (postmenopausal): Secondary | ICD-10-CM | POA: Diagnosis not present

## 2016-12-09 DIAGNOSIS — M48062 Spinal stenosis, lumbar region with neurogenic claudication: Secondary | ICD-10-CM | POA: Diagnosis not present

## 2016-12-09 DIAGNOSIS — M47816 Spondylosis without myelopathy or radiculopathy, lumbar region: Secondary | ICD-10-CM | POA: Diagnosis not present

## 2016-12-09 DIAGNOSIS — Z79899 Other long term (current) drug therapy: Secondary | ICD-10-CM | POA: Diagnosis not present

## 2016-12-09 DIAGNOSIS — I509 Heart failure, unspecified: Secondary | ICD-10-CM | POA: Diagnosis not present

## 2016-12-09 DIAGNOSIS — M5441 Lumbago with sciatica, right side: Secondary | ICD-10-CM | POA: Diagnosis not present

## 2016-12-09 DIAGNOSIS — M9983 Other biomechanical lesions of lumbar region: Secondary | ICD-10-CM | POA: Diagnosis not present

## 2016-12-09 DIAGNOSIS — G8929 Other chronic pain: Secondary | ICD-10-CM | POA: Diagnosis not present

## 2016-12-09 DIAGNOSIS — J449 Chronic obstructive pulmonary disease, unspecified: Secondary | ICD-10-CM | POA: Diagnosis not present

## 2016-12-11 DIAGNOSIS — E349 Endocrine disorder, unspecified: Secondary | ICD-10-CM | POA: Diagnosis not present

## 2016-12-11 DIAGNOSIS — N289 Disorder of kidney and ureter, unspecified: Secondary | ICD-10-CM | POA: Diagnosis not present

## 2016-12-11 DIAGNOSIS — Z23 Encounter for immunization: Secondary | ICD-10-CM | POA: Diagnosis not present

## 2016-12-11 DIAGNOSIS — I10 Essential (primary) hypertension: Secondary | ICD-10-CM | POA: Diagnosis not present

## 2017-01-13 DIAGNOSIS — R5382 Chronic fatigue, unspecified: Secondary | ICD-10-CM | POA: Diagnosis not present

## 2017-01-13 DIAGNOSIS — Z79899 Other long term (current) drug therapy: Secondary | ICD-10-CM | POA: Diagnosis not present

## 2017-01-13 DIAGNOSIS — Z79890 Hormone replacement therapy (postmenopausal): Secondary | ICD-10-CM | POA: Diagnosis not present

## 2017-01-13 DIAGNOSIS — M9983 Other biomechanical lesions of lumbar region: Secondary | ICD-10-CM | POA: Diagnosis not present

## 2017-01-13 DIAGNOSIS — G8929 Other chronic pain: Secondary | ICD-10-CM | POA: Diagnosis not present

## 2017-01-13 DIAGNOSIS — M48062 Spinal stenosis, lumbar region with neurogenic claudication: Secondary | ICD-10-CM | POA: Diagnosis not present

## 2017-01-13 DIAGNOSIS — M533 Sacrococcygeal disorders, not elsewhere classified: Secondary | ICD-10-CM | POA: Diagnosis not present

## 2017-01-13 DIAGNOSIS — M47816 Spondylosis without myelopathy or radiculopathy, lumbar region: Secondary | ICD-10-CM | POA: Diagnosis not present

## 2017-01-13 DIAGNOSIS — J449 Chronic obstructive pulmonary disease, unspecified: Secondary | ICD-10-CM | POA: Diagnosis not present

## 2017-01-13 DIAGNOSIS — I509 Heart failure, unspecified: Secondary | ICD-10-CM | POA: Diagnosis not present

## 2017-01-24 DIAGNOSIS — Z79899 Other long term (current) drug therapy: Secondary | ICD-10-CM | POA: Diagnosis not present

## 2017-01-24 DIAGNOSIS — E559 Vitamin D deficiency, unspecified: Secondary | ICD-10-CM | POA: Diagnosis not present

## 2017-01-24 DIAGNOSIS — Z79890 Hormone replacement therapy (postmenopausal): Secondary | ICD-10-CM | POA: Diagnosis not present

## 2017-01-24 DIAGNOSIS — R5382 Chronic fatigue, unspecified: Secondary | ICD-10-CM | POA: Diagnosis not present

## 2017-01-24 DIAGNOSIS — D509 Iron deficiency anemia, unspecified: Secondary | ICD-10-CM | POA: Diagnosis not present

## 2017-02-10 DIAGNOSIS — M48062 Spinal stenosis, lumbar region with neurogenic claudication: Secondary | ICD-10-CM | POA: Diagnosis not present

## 2017-02-10 DIAGNOSIS — J449 Chronic obstructive pulmonary disease, unspecified: Secondary | ICD-10-CM | POA: Diagnosis not present

## 2017-02-10 DIAGNOSIS — R5382 Chronic fatigue, unspecified: Secondary | ICD-10-CM | POA: Diagnosis not present

## 2017-02-10 DIAGNOSIS — M9983 Other biomechanical lesions of lumbar region: Secondary | ICD-10-CM | POA: Diagnosis not present

## 2017-02-10 DIAGNOSIS — M533 Sacrococcygeal disorders, not elsewhere classified: Secondary | ICD-10-CM | POA: Diagnosis not present

## 2017-02-10 DIAGNOSIS — G894 Chronic pain syndrome: Secondary | ICD-10-CM | POA: Diagnosis not present

## 2017-02-10 DIAGNOSIS — M25511 Pain in right shoulder: Secondary | ICD-10-CM | POA: Diagnosis not present

## 2017-02-10 DIAGNOSIS — Z79899 Other long term (current) drug therapy: Secondary | ICD-10-CM | POA: Diagnosis not present

## 2017-02-10 DIAGNOSIS — I509 Heart failure, unspecified: Secondary | ICD-10-CM | POA: Diagnosis not present

## 2017-02-10 DIAGNOSIS — M47816 Spondylosis without myelopathy or radiculopathy, lumbar region: Secondary | ICD-10-CM | POA: Diagnosis not present

## 2017-02-10 DIAGNOSIS — G8929 Other chronic pain: Secondary | ICD-10-CM | POA: Diagnosis not present

## 2017-02-12 DIAGNOSIS — M5416 Radiculopathy, lumbar region: Secondary | ICD-10-CM | POA: Diagnosis not present

## 2017-02-12 DIAGNOSIS — M47816 Spondylosis without myelopathy or radiculopathy, lumbar region: Secondary | ICD-10-CM | POA: Diagnosis not present

## 2017-02-18 DIAGNOSIS — M4316 Spondylolisthesis, lumbar region: Secondary | ICD-10-CM | POA: Diagnosis not present

## 2017-03-04 DIAGNOSIS — M5416 Radiculopathy, lumbar region: Secondary | ICD-10-CM | POA: Diagnosis not present

## 2017-03-04 DIAGNOSIS — M47816 Spondylosis without myelopathy or radiculopathy, lumbar region: Secondary | ICD-10-CM | POA: Diagnosis not present

## 2017-03-05 DIAGNOSIS — M47816 Spondylosis without myelopathy or radiculopathy, lumbar region: Secondary | ICD-10-CM | POA: Diagnosis not present

## 2017-03-05 DIAGNOSIS — M4716 Other spondylosis with myelopathy, lumbar region: Secondary | ICD-10-CM | POA: Diagnosis not present

## 2017-03-05 DIAGNOSIS — M25511 Pain in right shoulder: Secondary | ICD-10-CM | POA: Diagnosis not present

## 2017-03-05 DIAGNOSIS — M4712 Other spondylosis with myelopathy, cervical region: Secondary | ICD-10-CM | POA: Diagnosis not present

## 2017-03-05 DIAGNOSIS — R937 Abnormal findings on diagnostic imaging of other parts of musculoskeletal system: Secondary | ICD-10-CM | POA: Diagnosis not present

## 2017-03-10 DIAGNOSIS — J449 Chronic obstructive pulmonary disease, unspecified: Secondary | ICD-10-CM | POA: Diagnosis not present

## 2017-03-10 DIAGNOSIS — G8929 Other chronic pain: Secondary | ICD-10-CM | POA: Diagnosis not present

## 2017-03-10 DIAGNOSIS — Z79899 Other long term (current) drug therapy: Secondary | ICD-10-CM | POA: Diagnosis not present

## 2017-03-10 DIAGNOSIS — M48062 Spinal stenosis, lumbar region with neurogenic claudication: Secondary | ICD-10-CM | POA: Diagnosis not present

## 2017-03-10 DIAGNOSIS — R5382 Chronic fatigue, unspecified: Secondary | ICD-10-CM | POA: Diagnosis not present

## 2017-03-10 DIAGNOSIS — M9983 Other biomechanical lesions of lumbar region: Secondary | ICD-10-CM | POA: Diagnosis not present

## 2017-03-10 DIAGNOSIS — M25511 Pain in right shoulder: Secondary | ICD-10-CM | POA: Diagnosis not present

## 2017-03-10 DIAGNOSIS — I509 Heart failure, unspecified: Secondary | ICD-10-CM | POA: Diagnosis not present

## 2017-03-10 DIAGNOSIS — M47816 Spondylosis without myelopathy or radiculopathy, lumbar region: Secondary | ICD-10-CM | POA: Diagnosis not present

## 2017-03-10 DIAGNOSIS — M533 Sacrococcygeal disorders, not elsewhere classified: Secondary | ICD-10-CM | POA: Diagnosis not present

## 2017-03-12 DIAGNOSIS — I2510 Atherosclerotic heart disease of native coronary artery without angina pectoris: Secondary | ICD-10-CM | POA: Diagnosis not present

## 2017-03-13 DIAGNOSIS — M5416 Radiculopathy, lumbar region: Secondary | ICD-10-CM | POA: Diagnosis not present

## 2017-03-13 DIAGNOSIS — M47816 Spondylosis without myelopathy or radiculopathy, lumbar region: Secondary | ICD-10-CM | POA: Diagnosis not present

## 2022-03-01 DEATH — deceased
